# Patient Record
Sex: Female | Born: 1995 | Race: White | Hispanic: No | Marital: Single | Smoking: Current every day smoker
Health system: Southern US, Community
[De-identification: ages and names within clinical notes are randomized; demographics above are authoritative.]

## PROBLEM LIST (undated history)

## (undated) ENCOUNTER — Emergency Department (HOSPITAL_COMMUNITY): Admission: EM | Payer: No Typology Code available for payment source | Source: Home / Self Care

## (undated) DIAGNOSIS — O24419 Gestational diabetes mellitus in pregnancy, unspecified control: Secondary | ICD-10-CM

## (undated) DIAGNOSIS — Z98891 History of uterine scar from previous surgery: Secondary | ICD-10-CM

## (undated) DIAGNOSIS — R51 Headache: Secondary | ICD-10-CM

## (undated) HISTORY — DX: Gestational diabetes mellitus in pregnancy, unspecified control: O24.419

## (undated) HISTORY — DX: Headache: R51

---
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
## 2017-01-01 ENCOUNTER — Inpatient Hospital Stay
Admission: RE | Admit: 2017-01-01 | Discharge: 2017-01-01 | Disposition: A | Discharge status: Home / Self Care | Payer: Medicaid Other | Attending: Obstetrics and Gynecology | Admitting: Obstetrics and Gynecology

## 2017-01-01 DIAGNOSIS — IMO0001 Reserved for inherently not codable concepts without codable children: Secondary | ICD-10-CM

## 2017-01-01 DIAGNOSIS — O24414 Gestational diabetes mellitus in pregnancy, insulin controlled: Secondary | ICD-10-CM | POA: Insufficient documentation

## 2017-01-01 DIAGNOSIS — Z794 Long term (current) use of insulin: Secondary | ICD-10-CM | POA: Insufficient documentation

## 2017-01-01 DIAGNOSIS — Z369 Encounter for antenatal screening, unspecified: Secondary | ICD-10-CM

## 2017-01-01 DIAGNOSIS — Z3A35 35 weeks gestation of pregnancy: Secondary | ICD-10-CM | POA: Diagnosis not present

## 2017-01-01 DIAGNOSIS — O24419 Gestational diabetes mellitus in pregnancy, unspecified control: Secondary | ICD-10-CM

## 2017-01-01 MED ORDER — INSULIN NPH ISOPHANE & REGULAR (70-30) 100 UNIT/ML ~~LOC~~ SUSP: 26.0000 [IU] | Freq: Every day | SUBCUTANEOUS | 11 refills | Status: DC

## 2017-01-01 MED ORDER — INSULIN NPH (HUMAN) (ISOPHANE) 100 UNIT/ML ~~LOC~~ SUSP: 46.0000 [IU] | Freq: Every day | SUBCUTANEOUS | 11 refills | Status: DC

## 2017-01-01 MED ORDER — INSULIN NPH (HUMAN) (ISOPHANE) 100 UNIT/ML ~~LOC~~ SUSP: 26.0000 [IU] | Freq: Every day | SUBCUTANEOUS | 11 refills | Status: DC

## 2017-01-01 MED ORDER — INSULIN NPH ISOPHANE & REGULAR (70-30) 100 UNIT/ML ~~LOC~~ SUSP: 16.0000 [IU] | Freq: Every day | SUBCUTANEOUS | 11 refills | Status: DC

## 2017-01-01 NOTE — Discharge Instructions (Signed)
Take meds as prescribed. Monitor FKC's daily.

## 2017-01-01 NOTE — Discharge Summary (Signed)
Obstetric Discharge Summary Reason for Admission:Insulin Dep diabetic on pump in pregnancy Prenatal Procedures: NST Intrapartum Procedures: None yet Postpartum Procedures: none Complications-Operative and Postpartum: none Hemoglobin  Date Value Ref Range Status  12/07/2016 12.1 12.0 - 16.0 g/dL Final   HCT  Date Value Ref Range Status  12/07/2016 34.4 (L) 35.0 - 47.0 % Final    Physical Exam:  General: alert and cooperative UC: no UC noted on monitor strip FHT: reactive NST with multiple accels noted 15 x 15 BPM, no decels, Cat 1  Discharge Diagnoses: Insulin Dep Diabetic with reactive NST  Discharge Information: FU Monday at [facility] Date: 01/01/2017 Activity: Up ad lib Diet: Normal  Medications: see MMR Condition: Stable  Instructions:Monitor FKC's daily NPH Humulin 40 u before breakfast, 16 units NPH before bedtime, 70/30 20 units in am  Newborn Data: This patient has no babies on file.  Catheryn Bacon 01/01/2017, 2:58 PM

## 2017-01-01 NOTE — OB Triage Note (Signed)
Pt arrived to OBS 1 for her biweekly NST. Pt placed on monitor.

## 2017-01-04 ENCOUNTER — Encounter: Payer: Self-pay | Admitting: *Deleted

## 2017-01-04 ENCOUNTER — Inpatient Hospital Stay
Admission: RE | Admit: 2017-01-04 | Discharge: 2017-01-04 | Disposition: A | Discharge status: Home / Self Care | Payer: Medicaid Other | Attending: Obstetrics and Gynecology | Admitting: Obstetrics and Gynecology

## 2017-01-04 DIAGNOSIS — O1213 Gestational proteinuria, third trimester: Secondary | ICD-10-CM | POA: Diagnosis not present

## 2017-01-04 DIAGNOSIS — Z3A35 35 weeks gestation of pregnancy: Secondary | ICD-10-CM | POA: Insufficient documentation

## 2017-01-04 DIAGNOSIS — O24414 Gestational diabetes mellitus in pregnancy, insulin controlled: Secondary | ICD-10-CM | POA: Diagnosis not present

## 2017-01-04 DIAGNOSIS — Z369 Encounter for antenatal screening, unspecified: Secondary | ICD-10-CM

## 2017-01-04 LAB — CBC WITH DIFFERENTIAL/PLATELET
BASOS ABS: 0 10*3/uL (ref 0–0.1)
BASOS PCT: 0 %
Eosinophils Absolute: 0.1 10*3/uL (ref 0–0.7)
Eosinophils Relative: 1 %
HEMATOCRIT: 35.9 % (ref 35.0–47.0)
HEMOGLOBIN: 12.6 g/dL (ref 12.0–16.0)
Lymphocytes Relative: 24 %
Lymphs Abs: 2.2 10*3/uL (ref 1.0–3.6)
MCH: 30.3 pg (ref 26.0–34.0)
MCHC: 34.9 g/dL (ref 32.0–36.0)
MCV: 86.8 fL (ref 80.0–100.0)
Monocytes Absolute: 0.5 10*3/uL (ref 0.2–0.9)
Monocytes Relative: 6 %
NEUTROS ABS: 6.3 10*3/uL (ref 1.4–6.5)
NEUTROS PCT: 69 %
Platelets: 322 10*3/uL (ref 150–440)
RBC: 4.14 MIL/uL (ref 3.80–5.20)
RDW: 13.1 % (ref 11.5–14.5)
WBC: 9.1 10*3/uL (ref 3.6–11.0)

## 2017-01-04 LAB — COMPREHENSIVE METABOLIC PANEL
ALBUMIN: 3 g/dL — AB (ref 3.5–5.0)
ALK PHOS: 105 U/L (ref 38–126)
ALT: 20 U/L (ref 14–54)
AST: 33 U/L (ref 15–41)
Anion gap: 7 (ref 5–15)
BILIRUBIN TOTAL: 0.3 mg/dL (ref 0.3–1.2)
BUN: 10 mg/dL (ref 6–20)
CALCIUM: 9.3 mg/dL (ref 8.9–10.3)
CO2: 23 mmol/L (ref 22–32)
Chloride: 105 mmol/L (ref 101–111)
Creatinine, Ser: 0.48 mg/dL (ref 0.44–1.00)
GFR calc Af Amer: >60 mL/min (ref >60)
GFR calc non Af Amer: >60 mL/min (ref >60)
GLUCOSE: 79 mg/dL (ref 65–99)
Potassium: 3.7 mmol/L (ref 3.5–5.1)
SODIUM: 135 mmol/L (ref 135–145)
TOTAL PROTEIN: 6.8 g/dL (ref 6.5–8.1)

## 2017-01-04 LAB — URIC ACID: Uric Acid, Serum: 4.3 mg/dL (ref 2.3–6.6)

## 2017-01-04 LAB — PROTEIN / CREATININE RATIO, URINE
Creatinine, Urine: 165 mg/dL
PROTEIN CREATININE RATIO: 0.29 mg/mg{creat} — AB (ref 0.00–0.15)
Total Protein, Urine: 48 mg/dL

## 2017-01-04 NOTE — OB Triage Note (Signed)
G1P0 presents at [redacted]w[redacted]d for NST for gestational diabetes and preeclampsia workup. Was at Routine prenatal visit and had an elevated blood pressure.

## 2017-01-04 NOTE — Discharge Summary (Signed)
Pt d/c'd to home. Given container and instructions for 24 hour urine collection. Verbalized understanding. Given preeclampsia and preterm labor precautions.

## 2017-01-04 NOTE — Progress Notes (Signed)
Triage visit for NST   Julie [name] is a 21 y.o. G1P0. She is at [redacted]w[redacted]d gestation. She presents for a scheduled NST for insulin dependent gestational diabetes.  She had an elevated BP in the office today 137/91.  She denies HA, visual disturbances, and epigastric pain.  She endorses good fetal movement.  She denies ctxs, VB.  She is currently on Continue NPH 44 units in am & 22 units of reg in am and NPH 16 units and 14 units in the pm.  She did not bring her blood sugars into the office today, but she is planning to text them to Milon Score, CNM    O: Past Medical History:  Diagnosis Date  . Gestational diabetes   . ZOXWRUEA(540.9)    Past Surgical History:  Procedure Laterality Date  . TONSILLECTOMY     Social History   Social History  . Marital status: Single    Spouse name: N/A  . Number of children: N/A  . Years of education: N/A   Occupational History  . Not on file.   Social History Main Topics  . Smoking status: Never Smoker  . Smokeless tobacco: Never Used  . Alcohol use No  . Drug use:     Frequency: 2.0 times per week    Types: Marijuana     Comment: last time used before pregnancy  . Sexual activity: Yes   Other Topics Concern  . Not on file   Social History Narrative  . No narrative on file    BP 130/74   Pulse 89   LMP 05/04/2016 (Approximate)  Results for orders placed or performed during the hospital encounter of 01/04/17 (from the past 48 hour(s))  Protein / creatinine ratio, urine   Collection Time: 01/04/17 11:40 AM  Result Value Ref Range   Creatinine, Urine 165 mg/dL   Total Protein, Urine 48 mg/dL   Protein Creatinine Ratio 0.29 (H) 0.00 - 0.15 mg/mg[Cre]  CBC with Differential/Platelet   Collection Time: 01/04/17 12:20 PM  Result Value Ref Range   WBC 9.1 3.6 - 11.0 K/uL   RBC 4.14 3.80 - 5.20 MIL/uL   Hemoglobin 12.6 12.0 - 16.0 g/dL   HCT 81.1 91.4 - 78.2 %   MCV 86.8 80.0 - 100.0 fL   MCH 30.3 26.0 - 34.0 pg   MCHC 34.9 32.0  - 36.0 g/dL   RDW 95.6 21.3 - 08.6 %   Platelets 322 150 - 440 K/uL   Neutrophils Relative % 69 %   Neutro Abs 6.3 1.4 - 6.5 K/uL   Lymphocytes Relative 24 %   Lymphs Abs 2.2 1.0 - 3.6 K/uL   Monocytes Relative 6 %   Monocytes Absolute 0.5 0.2 - 0.9 K/uL   Eosinophils Relative 1 %   Eosinophils Absolute 0.1 0 - 0.7 K/uL   Basophils Relative 0 %   Basophils Absolute 0.0 0 - 0.1 K/uL  Comprehensive metabolic panel   Collection Time: 01/04/17 12:20 PM  Result Value Ref Range   Sodium 135 135 - 145 mmol/L   Potassium 3.7 3.5 - 5.1 mmol/L   Chloride 105 101 - 111 mmol/L   CO2 23 22 - 32 mmol/L   Glucose, Bld 79 65 - 99 mg/dL   BUN 10 6 - 20 mg/dL   Creatinine, Ser 5.78 0.44 - 1.00 mg/dL   Calcium 9.3 8.9 - 46.9 mg/dL   Total Protein 6.8 6.5 - 8.1 g/dL   Albumin 3.0 (L) 3.5 -  5.0 g/dL   AST 33 15 - 41 U/L   ALT 20 14 - 54 U/L   Alkaline Phosphatase 105 38 - 126 U/L   Total Bilirubin 0.3 0.3 - 1.2 mg/dL   GFR calc non Af Amer >60 >60 mL/min   GFR calc Af Amer >60 >60 mL/min   Anion gap 7 5 - 15  Uric acid   Collection Time: 01/04/17 12:20 PM  Result Value Ref Range   Uric Acid, Serum 4.3 2.3 - 6.6 mg/dL     Gen: NAD, AAOx3      Abd: FNTTP      Ext: Non-tender, Nonedmeatous    FHT: Baseline: 135 bpm/ moderate variability/ + 15x15 accels x 2/ no decels TOCO: quiet SVE:  deferred    A/P:  21 y.o. G1P0 [redacted]w[redacted]d with insulin dependent gestational diabetes and isolated elevated BP in office.     Reactive NST, with moderate variability and accelerations, no decels  In reviewing blood pressures, all BPs were normal, except for one that reveal 127/101.  Upon assessment, the patient did not have the cuff on properly and she states she rolled over onto the cuff. I believe this BP is inaccurate.    Proteinuria of 290 noted, we will send her home with a 24 hour urine and instructions reviewed   Growth US WNL today at office   Fetal Wellbeing: Reassuring Category 1 Fetal  tracing  D/c home stable, precautions reviewed, follow-up as scheduled.  Strict Fetal kick counts reviewed   Preterm labor and warning s/s reviewed   Next appt on 01/07/17 with Dr. Velta Addison, CNM

## 2017-01-06 ENCOUNTER — Other Ambulatory Visit
Admission: RE | Admit: 2017-01-06 | Discharge: 2017-01-06 | Disposition: A | Discharge status: Home / Self Care | Payer: Medicaid Other | Source: Ambulatory Visit | Attending: Obstetrics and Gynecology | Admitting: Obstetrics and Gynecology

## 2017-01-06 DIAGNOSIS — Z3A35 35 weeks gestation of pregnancy: Secondary | ICD-10-CM | POA: Insufficient documentation

## 2017-01-06 DIAGNOSIS — R030 Elevated blood-pressure reading, without diagnosis of hypertension: Secondary | ICD-10-CM | POA: Insufficient documentation

## 2017-01-06 DIAGNOSIS — O26893 Other specified pregnancy related conditions, third trimester: Secondary | ICD-10-CM | POA: Insufficient documentation

## 2017-01-06 DIAGNOSIS — O24414 Gestational diabetes mellitus in pregnancy, insulin controlled: Secondary | ICD-10-CM | POA: Diagnosis present

## 2017-01-06 LAB — PROTEIN, URINE, 24 HOUR
COLLECTION INTERVAL-UPROT: 24 h
PROTEIN, 24H URINE: 156 mg/d — AB (ref 50–100)
PROTEIN, URINE: 24 mg/dL
Urine Total Volume-UPROT: 650 mL

## 2017-01-07 ENCOUNTER — Inpatient Hospital Stay
Admission: RE | Admit: 2017-01-07 | Discharge: 2017-01-07 | Disposition: A | Discharge status: Home / Self Care | Payer: Medicaid Other | Attending: Obstetrics and Gynecology | Admitting: Obstetrics and Gynecology

## 2017-01-07 DIAGNOSIS — O24414 Gestational diabetes mellitus in pregnancy, insulin controlled: Secondary | ICD-10-CM | POA: Diagnosis present

## 2017-01-07 DIAGNOSIS — Z3A35 35 weeks gestation of pregnancy: Secondary | ICD-10-CM | POA: Insufficient documentation

## 2017-01-07 DIAGNOSIS — Z369 Encounter for antenatal screening, unspecified: Secondary | ICD-10-CM

## 2017-01-07 NOTE — Progress Notes (Signed)
Patient ID: Julie [name], female   DOB: [birth year], 21 y.o.   MRN: [id number] Julie [name] 19-Dec-1996 G1 P0 [redacted]w[redacted]d presents for NSt for HTN and A2GDM  noLOF , no vaginal bleeding , O;BP 138/81   Pulse 85   Temp 98.1 F (36.7 C) (Oral)   Resp 16   Ht 5\' 4"  (1.626 m)   Wt 264 lb (119.7 kg)   LMP 05/04/2016 (Approximate)   BMI 45.32 kg/m  ABDsoft NT  CX not checked  NSTreactive NSt  Labs: none  A: reactive NST A2 GDM  Insulin  P:cont 2x weekly NST testing

## 2017-01-07 NOTE — Discharge Summary (Signed)
  Suzy Bouchard, MD  Obstetrics    [] Hide copied text [] Hover for attribution information Patient ID: TRANY RADDEN, female   DOB: 02-27-1996, 21 y.o.   MRN: [id number] Zharia S Goldsmith Jan 02, 1996 G1 P0 [redacted]w[redacted]d presents for NSt for HTN and A2GDM  noLOF , no vaginal bleeding , O;BP 138/81   Pulse 85   Temp 98.1 F (36.7 C) (Oral)   Resp 16   Ht 5\' 4"  (1.626 m)   Wt 264 lb (119.7 kg)   LMP 05/04/2016 (Approximate)   BMI 45.32 kg/m  ABDsoft NT  CX not checked  NSTreactive NSt  Labs: none  A: reactive NST A2 GDM  Insulin  P:cont 2x weekly NST testing     Electronically signed by Suzy Bouchard, MD at 01/07/2017 11:11 AM      Admission (Current) on 1/11

## 2017-01-07 NOTE — Progress Notes (Signed)
Pt arrived with mother for her scheduled NST. Hx of Adolescense, Gestational Diabetes ( taking Insulin,). And query Gestational Hypertension., Pt of Dr schermerhorn

## 2017-01-07 NOTE — Discharge Instructions (Signed)
Pt and Mother on their way to regularly scheduled appt at 1100 with Dr Feliberto Gottron

## 2017-01-07 NOTE — Progress Notes (Signed)
Pt d/c'ed with Mother walking and belongings, to her 1100 appt with Dr Feliberto Gottron. Pt states she has already received information on Gestational Diabetes.

## 2017-01-11 ENCOUNTER — Encounter: Payer: Self-pay | Admitting: *Deleted

## 2017-01-11 ENCOUNTER — Inpatient Hospital Stay
Admission: RE | Admit: 2017-01-11 | Discharge: 2017-01-11 | Disposition: A | Discharge status: Home / Self Care | Payer: Medicaid Other | Attending: Obstetrics & Gynecology | Admitting: Obstetrics & Gynecology

## 2017-01-11 DIAGNOSIS — Z369 Encounter for antenatal screening, unspecified: Secondary | ICD-10-CM

## 2017-01-11 DIAGNOSIS — O0993 Supervision of high risk pregnancy, unspecified, third trimester: Secondary | ICD-10-CM | POA: Insufficient documentation

## 2017-01-11 DIAGNOSIS — Z3A36 36 weeks gestation of pregnancy: Secondary | ICD-10-CM | POA: Insufficient documentation

## 2017-01-11 LAB — UIFE/LIGHT CHAINS/TP QN, 24-HR UR
% BETA, Urine: 30.5 %
ALPHA 1 URINE: 5.2 %
Albumin, U: 15.2 %
Alpha 2, Urine: 25.7 %
Free Kappa/Lambda Ratio: 16 — ABNORMAL HIGH (ref 2.04–10.37)
Free Lambda Lt Chains,Ur: 10.5 mg/L — ABNORMAL HIGH (ref 0.24–6.66)
Free Lt Chn Excr Rate: 168 mg/L — ABNORMAL HIGH (ref 1.35–24.19)
GAMMA GLOBULIN URINE: 23.3 %
PDF: 0
Total Protein, Urine-Ur/day: 353 mg/24 hr — ABNORMAL HIGH (ref 30–150)
Total Protein, Urine: 12.4 mg/dL

## 2017-01-11 NOTE — OB Triage Note (Addendum)
Pt. Here for scheduled NST, also concerns about swelling in hands, feet, legs and vagina. Clean catch urine collected. Last sexual encounter was a "couple of weeks ago". Denies sudden gush of fluid or vaginal bleeding, small amount of white color discharge. Pain 0/10. Per patient -  blood sugar checked this morning and it was 86 mg/dl before breakfast. Pt. States, she did not check blood sugar after breakfast. See records for insulin requirements.  Positive for fetal movement.

## 2017-01-11 NOTE — Discharge Instructions (Signed)
Discharge instructions reviewed with patient, patient verbalized understanding. Patient encouraged to keep scheduled OB appointments.  NST reactive, patient discharged to home. Labor precautions reviewed with patient.

## 2017-01-13 NOTE — Discharge Summary (Signed)
Julie [name] is a 21 y.o. female. She is at [redacted]w[redacted]d gestation. Patient's last menstrual period was 05/04/2016 (approximate). Estimated Date of Delivery: 02/07/17    S: Resting comfortably. no CTX, no VB.no LOF,  Active fetal movement.      Maternal Medical History:   Past Medical History:  Diagnosis Date  . Gestational diabetes   . WVTVNRWC(136.4)     Past Surgical History:  Procedure Laterality Date  . TONSILLECTOMY      No Known Allergies  Prior to Admission medications   Medication Sig Start Date End Date Taking? Authorizing Provider  insulin NPH Human (HUMULIN N,NOVOLIN N) 100 UNIT/ML injection Inject 0.46 mLs (46 Units total) into the skin daily before breakfast. 01/01/17  Yes Sharee Pimple, CNM  insulin NPH Human (HUMULIN N,NOVOLIN N) 100 UNIT/ML injection Inject 0.26 mLs (26 Units total) into the skin at bedtime. Patient taking differently: Inject 24 Units into the skin at bedtime.  01/01/17  Yes Sharee Pimple, CNM  insulin NPH-regular Human (NOVOLIN 70/30) (70-30) 100 UNIT/ML injection Inject 26 Units into the skin daily with breakfast. 01/01/17  Yes Sharee Pimple, CNM  insulin NPH-regular Human (NOVOLIN 70/30) (70-30) 100 UNIT/ML injection Inject 16 Units into the skin daily with supper. 01/01/17  Yes Sharee Pimple, CNM  Prenatal Vit-Fe Fumarate-FA (MULTIVITAMIN-PRENATAL) 27-0.8 MG TABS tablet Take 1 tablet by mouth daily at 12 noon.   Yes Historical Provider, MD  amoxicillin (AMOXIL) 875 MG tablet Take 1 tablet (875 mg total) by mouth 2 (two) times daily. 12/07/16   Sharman Cheek, MD  IRON PO Take 1 tablet by mouth daily.    Historical Provider, MD     Prenatal care site:  [facility]   Social History: She  reports that she has quit smoking. Her smoking use included Cigarettes. She has never used smokeless tobacco. She reports that she uses drugs, including Marijuana, about 2 times per week. She reports that she does not drink alcohol.  Family History:  family history includes Anxiety disorder in her maternal grandmother and mother; Depression in her maternal grandmother and mother; Diabetes in her mother; Migraines in her maternal aunt, maternal grandmother, and maternal uncle.   Review of Systems: A full review of systems was performed and negative except as noted in the HPI.     O:  BP 136/83   Pulse 90   Temp 98.3 F (36.8 C) (Oral)   Resp 20   LMP 05/04/2016 (Approximate)   Constitutional: NAD, AAOx3  HE/ENT: extraocular movements grossly intact, moist mucous membranes CV: RRR PULM: nl respiratory effort, CTABL     Abd: gravid, non-tender, non-distended, soft      Ext: Non-tender, Nonedmeatous   Psych: mood appropriate, speech normal Pelvic:  NST performed TOCO: quiet    A/P:  20yo G1P0 @ 36.2 here for NST for high risk pregnancy, insulin-dependent diabetes  Labor: not present.   Fetal Wellbeing: Reassuring Cat 1 tracing.  Reactive NST  D/c home stable, precautions reviewed, follow-up as scheduled.   ----- Ranae Plumber, MD Attending Obstetrician and Gynecologist [facility], Department of OB/GYN [facility]

## 2017-01-13 NOTE — Procedures (Signed)
NST Patient's last menstrual period was 05/04/2016 (approximate). Estimated Date of Delivery: 02/07/17   Baseline: 150 Variability: moderate Accelerations present x >2 Decelerations absent Time  Interpretation: reactive NST, category 1 tracing  ----- Ranae Plumber, MD Attending Obstetrician and Gynecologist [facility], Department of OB/GYN [facility]

## 2017-01-15 ENCOUNTER — Inpatient Hospital Stay
Admission: RE | Admit: 2017-01-15 | Discharge: 2017-01-15 | Disposition: A | Discharge status: Home / Self Care | Payer: Medicaid Other | Attending: Obstetrics and Gynecology | Admitting: Obstetrics and Gynecology

## 2017-01-15 DIAGNOSIS — Z3A36 36 weeks gestation of pregnancy: Secondary | ICD-10-CM | POA: Diagnosis not present

## 2017-01-15 DIAGNOSIS — O24414 Gestational diabetes mellitus in pregnancy, insulin controlled: Secondary | ICD-10-CM | POA: Diagnosis not present

## 2017-01-15 DIAGNOSIS — O163 Unspecified maternal hypertension, third trimester: Secondary | ICD-10-CM | POA: Insufficient documentation

## 2017-01-15 NOTE — Progress Notes (Addendum)
   Triage visit for NST   Julie [name] is a 21 y.o. G1P0. She is at [redacted]w[redacted]d gestation. She presents for a scheduled NST.   Indication: Uncontrolled Insulin Dependent Gestational Diabetes and Gestational Hypertension   S: Resting comfortably. no CTX, no VB. Active fetal movement. No concerns   O:  BP (!) 142/82 (BP Location: Left Arm)   Pulse 92   Temp 98.7 F (37.1 C) (Oral)   Resp 20   LMP 05/04/2016 (Approximate)  No results found for this or any previous visit (from the past 48 hour(s)).   Repeat BP 135/74  Gen: NAD, AAOx3      Abd: FNTTP      Ext: Non-tender, Nonedmeatous    FHT: Baseline: 140 bpm  Moderate Variability/ 15 x 15 accelerations >2 / no decelerations TOCO: UI SVE:  deferred    A/P:  21 y.o. G1P0 [redacted]w[redacted]d with Uncontrolled Insulin Dependent Gestational Diabetes and Gestational Hypertension     Reactive NST, with moderate variability and accelerations, no decels  Category 1 Fetal tracing   Fetal Wellbeing: Reassuring  Strict Fetal Kick Counts  Preterm labor warning s/s reviewed   D/c home stable, precautions reviewed, follow-up as scheduled.   Missed appointment on 01/13/17 due to snow, scheduled f/u visit next week at [facility]  24 hour urine pending at [facility]   IOL scheduled for 01/25/17  Carlean Jews, CNM

## 2017-01-15 NOTE — Discharge Instructions (Signed)
Discharge instruction given and reviewed. Patient stated understanding. Please return to L&D Monday 1/22/2017at 1000am

## 2017-01-15 NOTE — Discharge Summary (Signed)
Pt d/c'd home after reactive NST. BP retaken upon d/c, 135/74. Follow up appointment made. No other concerns.

## 2017-01-18 ENCOUNTER — Observation Stay
Admission: AD | Admit: 2017-01-18 | Discharge: 2017-01-18 | Disposition: A | Discharge status: Home / Self Care | Payer: Medicaid Other | Source: Ambulatory Visit | Attending: Obstetrics and Gynecology | Admitting: Obstetrics and Gynecology

## 2017-01-18 DIAGNOSIS — Z3A37 37 weeks gestation of pregnancy: Secondary | ICD-10-CM | POA: Insufficient documentation

## 2017-01-18 DIAGNOSIS — O24419 Gestational diabetes mellitus in pregnancy, unspecified control: Secondary | ICD-10-CM | POA: Diagnosis present

## 2017-01-18 DIAGNOSIS — Z794 Long term (current) use of insulin: Secondary | ICD-10-CM | POA: Diagnosis not present

## 2017-01-18 DIAGNOSIS — O24414 Gestational diabetes mellitus in pregnancy, insulin controlled: Principal | ICD-10-CM | POA: Insufficient documentation

## 2017-01-18 LAB — OB RESULTS CONSOLE GBS: GBS: POSITIVE

## 2017-01-18 LAB — OB RESULTS CONSOLE GC/CHLAMYDIA
CHLAMYDIA, DNA PROBE: NEGATIVE
Gonorrhea: NEGATIVE

## 2017-01-18 NOTE — Plan of Care (Signed)
Reactive NST. Strip reviewed by dr Dalbert Garnet. Pt has appointment with dr schermerhorn at [facility] office today. Pt d/c home with d/c instructions

## 2017-01-18 NOTE — OB Triage Provider Note (Signed)
   Triage visit for NST   Julie [name] is a 21 y.o. G1P0. She is at [redacted]w[redacted]d gestation. She presents for a scheduled NST.  Indication: insulin dependent gestational diabetes  S: Resting comfortably. no CTX, no VB. Active fetal movement. O:  BP 134/72   Pulse 83   Temp 98.9 F (37.2 C)   LMP 05/04/2016 (Approximate)  No results found for this or any previous visit (from the past 48 hour(s)).   Gen: NAD, AAOx3      Abd: FNTTP      Ext: Non-tender, Nonedmeatous    NST  Baseline: 140 Variability: moderate Accelerations present x >2 Decelerations absent Time  Interpretation: reactive NST, category 1 tracing  A/P:  21 y.o. G1P0 [redacted]w[redacted]d with gDMA2.    Reactive NST, with moderate variability and accelerations, no decels  Fetal Wellbeing: Reassuring  D/c home stable, precautions reviewed, follow-up as scheduled.     ----- Christeen Douglas, MD MPH Attending Obstetrician and Gynecologist [facility], Department of OB/GYN [facility]

## 2017-01-18 NOTE — Plan of Care (Signed)
Pt here for NST 

## 2017-01-20 ENCOUNTER — Other Ambulatory Visit: Payer: Self-pay | Admitting: Obstetrics and Gynecology

## 2017-01-21 ENCOUNTER — Inpatient Hospital Stay
Admission: RE | Admit: 2017-01-21 | Discharge: 2017-01-21 | Disposition: A | Discharge status: Home / Self Care | Payer: Medicaid Other | Attending: Obstetrics & Gynecology | Admitting: Obstetrics & Gynecology

## 2017-01-21 DIAGNOSIS — Z3A37 37 weeks gestation of pregnancy: Secondary | ICD-10-CM | POA: Insufficient documentation

## 2017-01-21 DIAGNOSIS — O24414 Gestational diabetes mellitus in pregnancy, insulin controlled: Secondary | ICD-10-CM | POA: Insufficient documentation

## 2017-01-21 NOTE — Progress Notes (Signed)
Patient ID: [name], female   DOB: [birth year], 21 y.o.   MRN: [id number]                [] Hide copied text [] Hover for attribution information   Triage visit for NST   Julie [name] is a 21 y.o. G1P0. She is at 37w4/7 gestation. She presents for a scheduled NST.  Indication: insulin dependent gestational diabetes  S: Resting comfortably. no CTX, no VB. Active fetal movement. O:  BP 134/72   Pulse 83   Temp 98.9 F (37.2 C)   LMP 05/04/2016 (Approximate)  No results found for this or any previous visit (from the past 48 hour(s)).   Gen: NAD, AAOx3                                           Abd: Gravid                                                    Ext: Neg Homans                     NST Reactive with 2 accels 15 x 15 BPM  Baseline: 140 Variability: moderate Accelerations present x >2 Decelerations absent Time  Interpretation: reactive NST, category 1 tracing  A/P:  21 y.o. G1P0 [redacted]w[redacted]d with gDMA2.    Reactive NST, with moderate variability and accelerations, no decels  Fetal Wellbeing: Reassuring  D/c home stable, precautions reviewed, follow-up as scheduled.                       Pt to fu on Monday for IOL 01/25/17 as scheduled.   ----- Sharee Pimple, RN, MSN, CNM, FNP [facility]

## 2017-01-21 NOTE — OB Triage Note (Signed)
[name] here for scheduled NST, no complaints, states GDM well controlled, no issues with using insulin, AM fsbs 85

## 2017-01-25 ENCOUNTER — Inpatient Hospital Stay
Admission: RE | Admit: 2017-01-25 | Discharge: 2017-01-30 | DRG: 766 | Disposition: A | Discharge status: Home / Self Care | Payer: Medicaid Other | Attending: Obstetrics and Gynecology | Admitting: Obstetrics and Gynecology

## 2017-01-25 DIAGNOSIS — Z3A38 38 weeks gestation of pregnancy: Secondary | ICD-10-CM | POA: Diagnosis not present

## 2017-01-25 DIAGNOSIS — Z9889 Other specified postprocedural states: Secondary | ICD-10-CM

## 2017-01-25 DIAGNOSIS — O3483 Maternal care for other abnormalities of pelvic organs, third trimester: Secondary | ICD-10-CM | POA: Diagnosis present

## 2017-01-25 DIAGNOSIS — O24424 Gestational diabetes mellitus in childbirth, insulin controlled: Principal | ICD-10-CM | POA: Diagnosis present

## 2017-01-25 DIAGNOSIS — Z87891 Personal history of nicotine dependence: Secondary | ICD-10-CM | POA: Diagnosis not present

## 2017-01-25 DIAGNOSIS — O99214 Obesity complicating childbirth: Secondary | ICD-10-CM | POA: Diagnosis present

## 2017-01-25 DIAGNOSIS — O99824 Streptococcus B carrier state complicating childbirth: Secondary | ICD-10-CM | POA: Diagnosis present

## 2017-01-25 DIAGNOSIS — I501 Left ventricular failure: Secondary | ICD-10-CM | POA: Diagnosis not present

## 2017-01-25 DIAGNOSIS — J918 Pleural effusion in other conditions classified elsewhere: Secondary | ICD-10-CM | POA: Diagnosis not present

## 2017-01-25 DIAGNOSIS — O24419 Gestational diabetes mellitus in pregnancy, unspecified control: Secondary | ICD-10-CM | POA: Diagnosis present

## 2017-01-25 DIAGNOSIS — I429 Cardiomyopathy, unspecified: Secondary | ICD-10-CM | POA: Diagnosis not present

## 2017-01-25 DIAGNOSIS — N838 Other noninflammatory disorders of ovary, fallopian tube and broad ligament: Secondary | ICD-10-CM | POA: Diagnosis present

## 2017-01-25 DIAGNOSIS — I5031 Acute diastolic (congestive) heart failure: Secondary | ICD-10-CM | POA: Diagnosis not present

## 2017-01-25 LAB — GLUCOSE, CAPILLARY: GLUCOSE-CAPILLARY: 137 mg/dL — AB (ref 65–99)

## 2017-01-25 LAB — CBC
HCT: 33.6 % — ABNORMAL LOW (ref 35.0–47.0)
Hemoglobin: 11.9 g/dL — ABNORMAL LOW (ref 12.0–16.0)
MCH: 30.8 pg (ref 26.0–34.0)
MCHC: 35.6 g/dL (ref 32.0–36.0)
MCV: 86.5 fL (ref 80.0–100.0)
PLATELETS: 337 10*3/uL (ref 150–440)
RBC: 3.88 MIL/uL (ref 3.80–5.20)
RDW: 13.6 % (ref 11.5–14.5)
WBC: 9.6 10*3/uL (ref 3.6–11.0)

## 2017-01-25 LAB — TYPE AND SCREEN
ABO/RH(D): O POS
Antibody Screen: NEGATIVE

## 2017-01-25 MED ORDER — ONDANSETRON HCL 4 MG/2ML IJ SOLN: 4.0000 mg | Freq: Four times a day (QID) | INTRAMUSCULAR | Status: DC | PRN

## 2017-01-25 MED ORDER — LACTATED RINGERS IV SOLN
INTRAVENOUS | Status: DC
  Administered 2017-01-25 – 2017-01-27 (×6): via INTRAVENOUS

## 2017-01-25 MED ORDER — OXYCODONE-ACETAMINOPHEN 5-325 MG PO TABS: 2.0000 | ORAL_TABLET | ORAL | Status: DC | PRN

## 2017-01-25 MED ORDER — SOD CITRATE-CITRIC ACID 500-334 MG/5ML PO SOLN
30.0000 mL | ORAL | Status: DC | PRN
Start: 2017-01-25 — End: 2017-01-28
  Administered 2017-01-28: 30 mL via ORAL
  Filled 2017-01-25: qty 15

## 2017-01-25 MED ORDER — INSULIN DETEMIR 100 UNIT/ML ~~LOC~~ SOLN
24.0000 [IU] | Freq: Every day | SUBCUTANEOUS | Status: DC
  Administered 2017-01-25: 24 [IU] via SUBCUTANEOUS
  Filled 2017-01-25 (×2): qty 0.24

## 2017-01-25 MED ORDER — OXYTOCIN 40 UNITS IN LACTATED RINGERS INFUSION - SIMPLE MED: 2.5000 [IU]/h | INTRAVENOUS | Status: DC

## 2017-01-25 MED ORDER — LIDOCAINE HCL (PF) 1 % IJ SOLN
30.0000 mL | INTRAMUSCULAR | Status: DC | PRN
  Filled 2017-01-25: qty 30

## 2017-01-25 MED ORDER — DINOPROSTONE 10 MG VA INST
10.0000 mg | VAGINAL_INSERT | Freq: Once | VAGINAL | Status: AC
  Administered 2017-01-25: 10 mg via VAGINAL
  Filled 2017-01-25: qty 1

## 2017-01-25 MED ORDER — ZOLPIDEM TARTRATE 5 MG PO TABS
5.0000 mg | ORAL_TABLET | Freq: Every evening | ORAL | Status: DC | PRN
  Administered 2017-01-25 – 2017-01-26 (×2): 5 mg via ORAL
  Filled 2017-01-25 (×2): qty 1

## 2017-01-25 MED ORDER — OXYTOCIN BOLUS FROM INFUSION
500.0000 mL | Freq: Once | INTRAVENOUS | Status: DC
Start: 2017-01-25 — End: 2017-01-28

## 2017-01-25 MED ORDER — LACTATED RINGERS IV SOLN: 500.0000 mL | INTRAVENOUS | Status: DC | PRN

## 2017-01-25 MED ORDER — LIDOCAINE HCL (PF) 1 % IJ SOLN: 30.0000 mL | INTRAMUSCULAR | Status: DC | PRN

## 2017-01-25 MED ORDER — LACTATED RINGERS IV SOLN: INTRAVENOUS | Status: DC

## 2017-01-25 MED ORDER — BUTORPHANOL TARTRATE 1 MG/ML IJ SOLN
1.0000 mg | INTRAMUSCULAR | Status: DC | PRN
  Administered 2017-01-27: 1 mg via INTRAVENOUS
  Filled 2017-01-25: qty 1

## 2017-01-25 MED ORDER — LACTATED RINGERS IV SOLN
500.0000 mL | INTRAVENOUS | Status: DC | PRN
  Administered 2017-01-27 (×2): 500 mL via INTRAVENOUS
  Administered 2017-01-28: 1000 mL via INTRAVENOUS
  Administered 2017-01-28: 250 mL via INTRAVENOUS

## 2017-01-25 MED ORDER — ACETAMINOPHEN 325 MG PO TABS: 650.0000 mg | ORAL_TABLET | ORAL | Status: DC | PRN

## 2017-01-25 MED ORDER — INSULIN ASPART 100 UNIT/ML ~~LOC~~ SOLN
16.0000 [IU] | Freq: Once | SUBCUTANEOUS | Status: AC
  Administered 2017-01-25: 16 [IU] via SUBCUTANEOUS
  Filled 2017-01-25: qty 0.16

## 2017-01-25 MED ORDER — BUTORPHANOL TARTRATE 1 MG/ML IJ SOLN: 1.0000 mg | INTRAMUSCULAR | Status: DC | PRN

## 2017-01-25 MED ORDER — SODIUM CHLORIDE 0.9 % IV SOLN
2.0000 g | Freq: Once | INTRAVENOUS | Status: AC
  Administered 2017-01-27: 2 g via INTRAVENOUS
  Filled 2017-01-25: qty 2000

## 2017-01-25 MED ORDER — OXYTOCIN BOLUS FROM INFUSION: 500.0000 mL | Freq: Once | INTRAVENOUS | Status: DC

## 2017-01-25 MED ORDER — OXYCODONE-ACETAMINOPHEN 5-325 MG PO TABS: 1.0000 | ORAL_TABLET | ORAL | Status: DC | PRN

## 2017-01-25 MED ORDER — OXYTOCIN 40 UNITS IN LACTATED RINGERS INFUSION - SIMPLE MED
2.5000 [IU]/h | INTRAVENOUS | Status: DC
  Filled 2017-01-25 (×2): qty 1000

## 2017-01-25 NOTE — H&P (Signed)
Julie [name] is a 21 y.o. female  G1P0 EGA 38+1presenting for induction  Insulin dep GDM , suboptimally controlled through a large portion of pregnancy . Current insulin AM: 46 N / 26 R  Pm: 24N / 16 R  Pt ate tonight  Her regular meal and [facility] will administer her insulin as she normally would have. LAst u/s 01/04/17 = efw: 2743gm (56%).  OB History    Gravida Para Term Preterm AB Living   1 0           SAB TAB Ectopic Multiple Live Births                 Past Medical History:  Diagnosis Date  . Gestational diabetes   . RFFMBWGY(659.9)    Past Surgical History:  Procedure Laterality Date  . TONSILLECTOMY     Family History: family history includes Anxiety disorder in her maternal grandmother and mother; Depression in her maternal grandmother and mother; Diabetes in her mother; Migraines in her maternal aunt, maternal grandmother, and maternal uncle. Social History:  reports that she has quit smoking. Her smoking use included Cigarettes. She has never used smokeless tobacco. She reports that she uses drugs, including Marijuana, about 2 times per week. She reports that she does not drink alcohol.     Maternal Diabetes: Yes:  Diabetes Type:  Insulin/Medication controlled Genetic Screening: Normal Maternal Ultrasounds/Referrals: Normal Fetal Ultrasounds or other Referrals:  Other:  Maternal Substance Abuse:  No Significant Maternal Medications:  None Significant Maternal Lab Results:  + GBS  Other Comments:  None  ROS History   Last menstrual period 05/04/2016. Exam Physical Exam  Lungs CTA  CV RRR  adb : soft NT  NST reassuring , no decels  Prenatal labs: ABO, Rh:  o+ Antibody:  neg Rubella:  Non Imm, Varicella non imm RPR:   neg HBsAg:   neg HIV:   neg GBS:   Positive  Assessment/Plan: A2GDM , insulin  Administer normal dosing tonight , give PM snack Place cervidil tonight Reassess in am Insulin dosing based on if the pt will be allowed to eat regular diet   + GBS  , start Ampicillin once pt is in active labor or PROM   Julie [name] 01/25/2017, 8:02 PM

## 2017-01-26 LAB — GLUCOSE, CAPILLARY
GLUCOSE-CAPILLARY: 123 mg/dL — AB (ref 65–99)
GLUCOSE-CAPILLARY: 83 mg/dL (ref 65–99)
GLUCOSE-CAPILLARY: 75 mg/dL (ref 65–99)
Glucose-Capillary: 128 mg/dL — ABNORMAL HIGH (ref 65–99)
Glucose-Capillary: 152 mg/dL — ABNORMAL HIGH (ref 65–99)

## 2017-01-26 MED ORDER — INSULIN REGULAR HUMAN 100 UNIT/ML IJ SOLN: 26.0000 [IU] | Freq: Once | INTRAMUSCULAR | Status: DC

## 2017-01-26 MED ORDER — DINOPROSTONE 10 MG VA INST
10.0000 mg | VAGINAL_INSERT | Freq: Once | VAGINAL | Status: AC
  Administered 2017-01-26: 10 mg via VAGINAL
  Filled 2017-01-26: qty 1

## 2017-01-26 MED ORDER — INSULIN ASPART 100 UNIT/ML ~~LOC~~ SOLN
0.0000 [IU] | Freq: Three times a day (TID) | SUBCUTANEOUS | Status: DC
  Filled 2017-01-26 (×3): qty 0.15

## 2017-01-26 MED ORDER — INSULIN ASPART 100 UNIT/ML ~~LOC~~ SOLN
0.0000 [IU] | Freq: Three times a day (TID) | SUBCUTANEOUS | Status: DC
Start: 2017-01-26 — End: 2017-01-30
  Filled 2017-01-26 (×4): qty 0.15

## 2017-01-26 MED ORDER — INSULIN ASPART 100 UNIT/ML ~~LOC~~ SOLN
16.0000 [IU] | Freq: Three times a day (TID) | SUBCUTANEOUS | Status: DC
  Administered 2017-01-26 – 2017-01-27 (×2): 16 [IU] via SUBCUTANEOUS
  Filled 2017-01-26 (×3): qty 0.16
  Filled 2017-01-26: qty 16
  Filled 2017-01-26 (×3): qty 0.16

## 2017-01-26 MED ORDER — INSULIN ASPART 100 UNIT/ML ~~LOC~~ SOLN
26.0000 [IU] | Freq: Once | SUBCUTANEOUS | Status: AC
  Administered 2017-01-26: 26 [IU] via SUBCUTANEOUS
  Filled 2017-01-26: qty 26
  Filled 2017-01-26: qty 0.26

## 2017-01-26 MED ORDER — INSULIN ASPART 100 UNIT/ML ~~LOC~~ SOLN
16.0000 [IU] | Freq: Three times a day (TID) | SUBCUTANEOUS | Status: DC
  Filled 2017-01-26 (×3): qty 0.16

## 2017-01-26 MED ORDER — MISOPROSTOL 25 MCG QUARTER TABLET
25.0000 ug | ORAL_TABLET | ORAL | Status: DC | PRN
  Administered 2017-01-26 (×2): 25 ug via VAGINAL
  Filled 2017-01-26 (×2): qty 0.25

## 2017-01-26 NOTE — Progress Notes (Signed)
Reassessment of the cervix revealed a tight 1 cm opening. Cervical exams are very difficult for the patient and she is feeling overwhelmed by the discomfort of them. The cervix is very posterior.  We will start Cervidil overnight again, instead of starting Pitocin or placing a Foley bulb at this time. If his cervix becomes mid position or she becomes less difficult to check, mechanical dilation will be considered.

## 2017-01-26 NOTE — Progress Notes (Signed)
Diabetes Treatment Program Recommendations  ADA Standards of Care 2017 Diabetes in Pregnancy Target Glucose Ranges: Results for Julie, [name] (MRN [id number]) as of 01/26/2017 12:04  Ref. Range 01/25/2017 20:46 01/26/2017 08:09  Glucose-Capillary Latest Ref Range: 65 - 99 mg/dL 841 (H) 660 (H)   Fasting: 60 - 90 mg/dL Preprandial: 60 - 630 mg/dL 1 hr postprandial: Less than 140mg /dL (from first bite of meal) 2 hr postprandial: Less than 120 mg/dL (from first bit of meal)  Referral received.  Note history of gestational diabetes with insulin Home meds per Care Everywhere:   NPH 46 units AM and 26 units PM.  Regular insulin 20 units with breakfast and 12 units with supper.  Patient told RN that her regular insulin was recently increased to 26 unit with breakfast.  Per MD order, patient will not receive long acting insulin this morning however she is ordered to get meal coverage 26 units to cover her meal this AM.  Discussed with RN, she will obtain CBG prior to giving Novolog as patient just finished her morning meal.    Once in active labor, orders will change to hourly blood sugars and IV insulin per GS if blood sugars>120 mg/dL.  Reviewed use of Glucostabilizer with RN.  Will follow.  Thanks, Beryl Meager, RN, BC-ADM Inpatient Diabetes Coordinator Pager [phone] (8a-5p)

## 2017-01-26 NOTE — Plan of Care (Signed)
Problem: Education: Goal: Knowledge of Childbirth will improve Outcome: Progressing Pt admitted for IOL, gestational diabetes in pregnancy. On regular and long acting insulin.FHR tracing remains reactive. Cervix closed, Cervidil placed for induction. GBS pos, antibiotics to be started in active labor.

## 2017-01-26 NOTE — Plan of Care (Signed)
Problem: Education: Goal: Ability to describe self-care measures that may prevent or decrease complications (Diabetes Survival Skills Education) will improve Outcome: Progressing BS POCT q4h, BG level 137 overnight

## 2017-01-26 NOTE — Progress Notes (Addendum)
Patient ID: Julie [name], female   DOB: 02/19/1996, 21 y.o.   MRN: [id number]  Will start the glucosstabilizer when pt begins labor and if BG >120. Will check cervix and start cytotec if unchanged.  FHT: Reactive, baseline 140, mod var, no decels Toco: flat

## 2017-01-26 NOTE — Progress Notes (Signed)
Pt continues to ambulate in hallway, says she prefers to walk now until she has to be checked again.  EFM tracing maternal HR d/t movement, occasional FHR checked, 145 bpm and Cat 1 reactive tracing obtained prior to pt leaving room to ambulate.

## 2017-01-26 NOTE — Progress Notes (Signed)
Julie [name] is a 21 y.o. G1P0 at [redacted]w[redacted]d iol for poorly controlled insulin dependent gestational diabetes. Now s/p 12hrs cervadil overnight, 2 doses of cytotec during the day today - last one placed at 1740  Subjective: Not feeling much. S/p dinner  Objective: BP 134/78   Pulse 86   Temp 98.7 F (37.1 C) (Oral)   Resp 16   Ht 5\' 4"  (1.626 m)   Wt 268 lb (121.6 kg)   LMP 05/04/2016 (Approximate)   SpO2 92%   BMI 46.00 kg/m  I/O last 3 completed shifts: In: 3110.4 [I.V.:3110.4] Out: -  No intake/output data recorded.  FHT:  FHR: 145, mod var, +accels, no decels  UC:   regular, every 2-4 minutes, not feeling them SVE:   Dilation: Fingertip Effacement (%): Thick Station: -3 Exam by:: [employer]  Labs: Lab Results  Component Value Date   WBC 9.6 01/25/2017   HGB 11.9 (L) 01/25/2017   HCT 33.6 (L) 01/25/2017   MCV 86.5 01/25/2017   PLT 337 01/25/2017    Assessment / Plan: Induction of labor due to gestational diabetes,    Contracting with PGE. Plan for pitocin start vs cervadil based on contraction pattern for further ripening overnight. Will attempt Foley bulb overnight if able.  FHT- reassuring Not ruptured Sliding scale insulin while not in labor. When active, will start gluclose stabilizer per protocol if blood sugar >120. Home insulin regimen was 46N/22R in am and 24N/16R in pm. Will not given long acting but will give 16R with meals with SSI correction as needed.  Anticipate vaginal delivery  Christeen Douglas 01/26/2017, 7:20 PM

## 2017-01-26 NOTE — Progress Notes (Addendum)
Plan for shift reviewed with pt, including s/s to report if evident. Pt confirms active fetal mvmt, denies spotting, vaginal bleeding, leaking fluid or painful contractions. Pt states she would like to ambulate in hallway for a while before next exam. Will repeat exam in couple hours with possible foley bulb and/or Pitocin titration tonight. Patient request Ambien to help her rest overnight. Will continue BS monitoring per orders. Dr Dalbert Garnet in room to speak with pt and discuss options and review IOL plans. Pt and family present in room, understanding verbalized.

## 2017-01-27 ENCOUNTER — Encounter: Payer: Self-pay | Admitting: Anesthesiology

## 2017-01-27 ENCOUNTER — Inpatient Hospital Stay: Payer: Medicaid Other | Admitting: Anesthesiology

## 2017-01-27 LAB — GLUCOSE, CAPILLARY
GLUCOSE-CAPILLARY: 87 mg/dL (ref 65–99)
GLUCOSE-CAPILLARY: 66 mg/dL (ref 65–99)
GLUCOSE-CAPILLARY: 81 mg/dL (ref 65–99)
Glucose-Capillary: 89 mg/dL (ref 65–99)
Glucose-Capillary: 72 mg/dL (ref 65–99)
Glucose-Capillary: 67 mg/dL (ref 65–99)

## 2017-01-27 LAB — RPR: RPR: NONREACTIVE

## 2017-01-27 MED ORDER — SODIUM CHLORIDE FLUSH 0.9 % IV SOLN
INTRAVENOUS | Status: AC
  Administered 2017-01-27: 18:00:00
  Filled 2017-01-27: qty 10

## 2017-01-27 MED ORDER — SODIUM CHLORIDE FLUSH 0.9 % IV SOLN
INTRAVENOUS | Status: AC
  Administered 2017-01-27: 17:00:00
  Filled 2017-01-27: qty 10

## 2017-01-27 MED ORDER — DEXTROSE IN LACTATED RINGERS 5 % IV SOLN
INTRAVENOUS | Status: DC
  Administered 2017-01-27 – 2017-01-28 (×2): via INTRAVENOUS

## 2017-01-27 MED ORDER — SODIUM CHLORIDE 0.9 % IV SOLN
INTRAVENOUS | Status: AC
  Administered 2017-01-27: 1 g via INTRAVENOUS
  Filled 2017-01-27: qty 1000

## 2017-01-27 MED ORDER — BUPIVACAINE HCL (PF) 0.25 % IJ SOLN
INTRAMUSCULAR | Status: DC | PRN
  Administered 2017-01-27: 10 mL via EPIDURAL

## 2017-01-27 MED ORDER — SODIUM CHLORIDE 0.9 % IV SOLN
INTRAVENOUS | Status: DC
  Filled 2017-01-27: qty 2.5

## 2017-01-27 MED ORDER — LACTATED RINGERS IV SOLN: 500.0000 mL | Freq: Once | INTRAVENOUS | Status: AC

## 2017-01-27 MED ORDER — PHENYLEPHRINE 40 MCG/ML (10ML) SYRINGE FOR IV PUSH (FOR BLOOD PRESSURE SUPPORT): 80.0000 ug | PREFILLED_SYRINGE | INTRAVENOUS | Status: DC | PRN

## 2017-01-27 MED ORDER — EPHEDRINE 5 MG/ML INJ: 10.0000 mg | INTRAVENOUS | Status: DC | PRN

## 2017-01-27 MED ORDER — BUTORPHANOL TARTRATE 1 MG/ML IJ SOLN: 1.0000 mg | INTRAMUSCULAR | Status: DC | PRN

## 2017-01-27 MED ORDER — LIDOCAINE HCL (PF) 1 % IJ SOLN
INTRAMUSCULAR | Status: DC | PRN
  Administered 2017-01-27: 3 mL

## 2017-01-27 MED ORDER — FENTANYL 2.5 MCG/ML W/ROPIVACAINE 0.2% IN NS 100 ML EPIDURAL INFUSION (ARMC-ANES)
10.0000 mL/h | EPIDURAL | Status: DC
  Administered 2017-01-27: 10 mL/h via EPIDURAL

## 2017-01-27 MED ORDER — OXYTOCIN 40 UNITS IN LACTATED RINGERS INFUSION - SIMPLE MED
1.0000 m[IU]/min | INTRAVENOUS | Status: DC
  Administered 2017-01-27: 1 m[IU]/min via INTRAVENOUS

## 2017-01-27 MED ORDER — LIDOCAINE-EPINEPHRINE (PF) 1.5 %-1:200000 IJ SOLN
INTRAMUSCULAR | Status: DC | PRN
  Administered 2017-01-27: 3 mL via PERINEURAL

## 2017-01-27 MED ORDER — DIPHENHYDRAMINE HCL 50 MG/ML IJ SOLN: 12.5000 mg | INTRAMUSCULAR | Status: DC | PRN

## 2017-01-27 MED ORDER — LIDOCAINE HCL (PF) 2 % IJ SOLN
INTRAMUSCULAR | Status: DC | PRN
  Administered 2017-01-27: 5 mL via INTRADERMAL

## 2017-01-27 MED ORDER — TERBUTALINE SULFATE 1 MG/ML IJ SOLN: 0.2500 mg | Freq: Once | INTRAMUSCULAR | Status: DC | PRN

## 2017-01-27 MED ORDER — SODIUM CHLORIDE 0.9 % IV SOLN
1.0000 g | INTRAVENOUS | Status: DC
  Administered 2017-01-27 – 2017-01-28 (×5): 1 g via INTRAVENOUS
  Filled 2017-01-27 (×8): qty 1000

## 2017-01-27 NOTE — Anesthesia Preprocedure Evaluation (Addendum)
Anesthesia Evaluation  Patient identified by MRN, date of birth, ID band Patient awake    Reviewed: Allergy & Precautions, NPO status , Patient's Chart, lab work & pertinent test results  Airway Mallampati: II  TM Distance: >3 FB     Dental no notable dental hx. (+) Chipped   Pulmonary former smoker,    Pulmonary exam normal        Cardiovascular negative cardio ROS Normal cardiovascular exam     Neuro/Psych  Headaches, negative psych ROS   GI/Hepatic negative GI ROS, Neg liver ROS,   Endo/Other  diabetes, Well Controlled, Type 2, Insulin Dependent, Oral Hypoglycemic Agents  Renal/GU negative Renal ROS  negative genitourinary   Musculoskeletal negative musculoskeletal ROS (+)   Abdominal Normal abdominal exam  (+)   Peds negative pediatric ROS (+)  Hematology negative hematology ROS (+)   Anesthesia Other Findings   Reproductive/Obstetrics (+) Pregnancy                            Anesthesia Physical Anesthesia Plan  ASA: II  Anesthesia Plan: Epidural   Post-op Pain Management:    Induction: Intravenous  Airway Management Planned: Natural Airway  Additional Equipment:   Intra-op Plan:   Post-operative Plan:   Informed Consent: I have reviewed the patients History and Physical, chart, labs and discussed the procedure including the risks, benefits and alternatives for the proposed anesthesia with the patient or authorized representative who has indicated his/her understanding and acceptance.   Dental advisory given  Plan Discussed with: CRNA and Surgeon  Anesthesia Plan Comments:         Anesthesia Quick Evaluation

## 2017-01-27 NOTE — Progress Notes (Signed)
Julie [name] is a 21 y.o. G1P0 at [redacted]w[redacted]d by dating/US here for IOL for A2GDM on insulin and poorly controlled.   Subjective: I am hurting and want an epidural  Objective: BP 129/61   Pulse 88   Temp (!) 96.7 F (35.9 C) (Axillary)   Resp (!) 22   Ht 5\' 4"  (1.626 m)   Wt 121.6 kg (268 lb)   LMP 05/04/2016 (Approximate)   SpO2 92%   BMI 46.00 kg/m  I/O last 3 completed shifts: In: 5936.4 [I.V.:5936.4] Out: -  Total I/O In: 19.6 [I.V.:19.6] Out: -   FHT: 140  UC:   q 2-3 mins, adequate MVU's SVE:   Dilation: 3 Effacement (%): 90 Station: -3 Exam by:: C. Jones AROM with clear fluid and IUPC inserted with bloody return.  Labs: Lab Results  Component Value Date   WBC 9.6 01/25/2017   HGB 11.9 (L) 01/25/2017   HCT 33.6 (L) 01/25/2017   MCV 86.5 01/25/2017   PLT 337 01/25/2017  Glucose is running in the 60-80 range on the D5LR.   Assessment / Plan: A: IUP at term for iOL due to A2GDM 2. GBS Pos P: Continue Pitocin per protocol 2. Continue IUPC/ext fetal monitors. 3.Planning epidural soon.  Labor: Progressing normally Preeclampsia:  none Fetal Wellbeing:  Category I Pain Control:  IV pain meds I/D:  n/a Anticipated MOD:  NSVD  Sharee Pimple 01/27/2017, 3:24 PM

## 2017-01-27 NOTE — Progress Notes (Signed)
Artice S Tabor is a 21 y.o. G1P0 at [redacted]w[redacted]d by  admitted for IOl for poorly controlled diabetes on insulin.   Subjective: "I am not feeling any UC's"  Objective: BP 97/79 (BP Location: Left Arm)   Pulse 99   Temp 98.3 F (36.8 C) (Oral)   Resp 20   Ht 5\' 4"  (1.626 m)   Wt 121.6 kg (268 lb)   LMP 05/04/2016 (Approximate)   SpO2 92%   BMI 46.00 kg/m  I/O last 3 completed shifts: In: 5936.4 [I.V.:5936.4] Out: -  No intake/output data recorded.  FHT:  130, no decels, +accels, cat 1 UC:  occas SVE:   1-2/80%/vtx-3 Cervidil removed as pt had no UC's and did not feel any pain Speculum inserted and betadine to cx. Foley bulb placed with 30 ml's  Labs: Lab Results  Component Value Date   WBC 9.6 01/25/2017   HGB 11.9 (L) 01/25/2017   HCT 33.6 (L) 01/25/2017   MCV 86.5 01/25/2017   PLT 337 01/25/2017   Assessment / Plan: A: IOL for Insulin Dep Diabetes (poorly controlled)  2. GBS pos P: Foley bulb placed and taped tightly to Lt thigh 2. Start Pitocin per protocol 3. Antibotics for GBs + 4. Antic SVD.  Labor: IOL Preeclampsia: none Fetal Wellbeing: reassuring Pain Control:  None at present, plans epidural  I/D:  none Anticipated MOD: NSVD  Sharee Pimple 01/27/2017, 8:27 AM

## 2017-01-27 NOTE — Progress Notes (Signed)
Julie [name] is a 21 y.o. G1P0 at [redacted]w[redacted]d with iol for poorly controlled gDMA2, now on home short-acting insulin (16R) with meals, without NPH but with sliding scale if needed. Glucose-stabilizer is ordered and pended and will be activated if BG >120 in active labor. She is currently on q4hr checks. Plan for q2hr checks in active labor.  Subjective: Feeling contractions mildly q91min, received stadol 1hr ago. Blood sugar in 80s since yesterday lunchtime. This is her second night of induction. She received cervadil overnight on day 1, q4hr cytotec during the day yesterday, and cervadil #2 placed last night at 2230. She is 1cm, posterior and uncomfortable with checks.  Objective: BP 97/79 (BP Location: Left Arm)   Pulse 99   Temp 98.3 F (36.8 C) (Oral)   Resp 20   Ht 5\' 4"  (1.626 m)   Wt 268 lb (121.6 kg)   LMP 05/04/2016 (Approximate)   SpO2 92%   BMI 46.00 kg/m  I/O last 3 completed shifts: In: 5936.4 [I.V.:5936.4] Out: -  No intake/output data recorded.  FHT:  135, mod var, +accels, no decels UC:   irregular, every 5 minutes SVE:   Dilation: 1 Effacement (%): 30 Station: -3 Exam by:: Dr Dalbert Garnet  Labs: Lab Results  Component Value Date   WBC 9.6 01/25/2017   HGB 11.9 (L) 01/25/2017   HCT 33.6 (L) 01/25/2017   MCV 86.5 01/25/2017   PLT 337 01/25/2017    Assessment / Plan:  Continue induction of labor. She is not in labor yet. Mechanical cervical dilation is reasonable when cervidil removed or with pitocin. - Continue 16R insulin with meals and q4hrs BG checks, and checks with meals. Sliding scale if needed. Gluose stabilizer protocl in active labor if needed. - Fetal status: reassuring   [name] 01/27/2017, 7:04 AM

## 2017-01-27 NOTE — Progress Notes (Signed)
Julie [name] is a 21 y.o. G1P0 at [redacted]w[redacted]d admitted for IOL for poorly controlled diabetes on insulin  Subjective: Feeling some UC  Objective: BP (!) 141/74   Pulse 81   Temp (!) 96.7 F (35.9 C) (Axillary)   Resp (!) 22   Ht 5\' 4"  (1.626 m)   Wt 121.6 kg (268 lb)   LMP 05/04/2016 (Approximate)   SpO2 92%   BMI 46.00 kg/m  I/O last 3 completed shifts: In: 5936.4 [I.V.:5936.4] Out: -  No intake/output data recorded.  FHT:  140, +accels, no decels Cat 1 UC:   regular, every 3  minutes SVE:   Dilation: 2.5 Effacement (%): 50 Station: -3 Exam by:: J. Grindheim  Labs: Lab Results  Component Value Date   WBC 9.6 01/25/2017   HGB 11.9 (L) 01/25/2017   HCT 33.6 (L) 01/25/2017   MCV 86.5 01/25/2017   PLT 337 01/25/2017    Assessment / Plan: A: A2 GDM at 38 2/7 weeks on Insulin (Glucostabilizer) P: Observe VS and FHR closely. 2. Monitor glucose and administer glucose by the stabiliizer orders. Labor: IOL with foley bulb and Pitocin drip  Fetal Wellbeing:  reassuring Pain Control: will plan Stadol and Epidural  Anticipated MOD:  Aiming for NSVD  Sharee Pimple 01/27/2017, 12:35 PM

## 2017-01-27 NOTE — Progress Notes (Signed)
Julie [name] is a 21 y.o. G1P0 at [redacted]w[redacted]d IOl for term pregnancy with poorly controlled DM on insulin prior to labor.   Subjective: I am comfortable now Objective: BP 137/74   Pulse 89   Temp 98.1 F (36.7 C) (Oral)   Resp 16   Ht 5\' 4"  (1.626 m)   Wt 121.6 kg (268 lb)   LMP 05/04/2016 (Approximate)   SpO2 98%   BMI 46.00 kg/m  I/O last 3 completed shifts: In: 4447.9 [I.V.:4447.9] Out: -  Total I/O In: 1000 [I.V.:1000] Out: -   FHT:  145, Cat 1, no decels, +accels (after epidurall pt had decel to 80's x 2 with recovery and had a late decel and it was recovered and now improved after repositioning and fluid bolus.  UC:   q 3 mins, MVU"s: 170 after Pitocin turned down for the decel. Will retitrate back up. On 8 mun/min SVE:   Dilation: 4 Effacement (%): 90 Station: -3 Exam by:: C. Jones CNM  Labs: Lab Results  Component Value Date   WBC 9.6 01/25/2017   HGB 11.9 (L) 01/25/2017   HCT 33.6 (L) 01/25/2017   MCV 86.5 01/25/2017   PLT 337 01/25/2017    Assessment / Plan: A: IUP at term 2. A2GDM poorly controlled with insulin 3. Teen pregnancy P: 1. Glucose Stabilizer protocol Glucose 60-80 zone so no insulin needed 2. Continue to labor during the night. Disc poss of LTCS with family as pt has an EFW  #8#7oz baby and has not delivered vaginally in the past. Pt does not have a contracted pelvis by exam but, could have a contracted pelvis above the ischial spines.  3.Continue to monitor closely and Dr Feliberto Gottron was given report 1 hour ago and aware of pt status and agrees with plan of care.    Labor: pattern is now inadequate due to cutting Pitocin but, will retitrate up now that FHR is Cat 1 Preeclampsia: none Fetal Wellbeing:  Reassuring Pain Control: controlled with epidural Anticipated MOD:  NSVD  Sharee Pimple 01/27/2017, 10:14 PM

## 2017-01-27 NOTE — Anesthesia Procedure Notes (Signed)
Epidural Patient location during procedure: OB Start time: 01/27/2017 8:11 PM End time: 01/27/2017 8:22 PM  Staffing Anesthesiologist: Yves Dill Performed: anesthesiologist   Preanesthetic Checklist Completed: patient identified, site marked, surgical consent, pre-op evaluation, timeout performed, IV checked, risks and benefits discussed and monitors and equipment checked  Epidural Patient position: sitting Prep: Betadine and site prepped and draped Patient monitoring: heart rate, cardiac monitor, continuous pulse ox and blood pressure Approach: midline Location: L3-L4 Injection technique: LOR air  Needle:  Needle type: Tuohy  Needle gauge: 18 G Needle length: 9 cm Needle insertion depth: 7 cm Catheter size: 20 Guage Test dose: negative and 1.5% lidocaine with Epi 1:200 K  Additional Notes Time out called .Marland Kitchen Patient placed in sitting position.  Back prepped and draped in sterile fashion.  A skin wheal was made in the L3-L4 interspace with 1% Lidocaine plain.  An 18G Tuohy needle was guided into the epidural space by a loss of resistance technique.  No blood or paresthesias.  Negative Test dose.  The catheter was affixed to the back in sterile fashion.  Patient tolerated the procedure well.Reason for block:procedure for pain

## 2017-01-28 ENCOUNTER — Encounter
Admission: RE | Disposition: A | Discharge status: Home / Self Care | Payer: Self-pay | Source: Home / Self Care | Attending: Obstetrics and Gynecology

## 2017-01-28 DIAGNOSIS — Z98891 History of uterine scar from previous surgery: Secondary | ICD-10-CM

## 2017-01-28 DIAGNOSIS — Z9889 Other specified postprocedural states: Secondary | ICD-10-CM

## 2017-01-28 HISTORY — DX: History of uterine scar from previous surgery: Z98.891

## 2017-01-28 LAB — GLUCOSE, CAPILLARY
GLUCOSE-CAPILLARY: 84 mg/dL (ref 65–99)
Glucose-Capillary: 101 mg/dL — ABNORMAL HIGH (ref 65–99)
Glucose-Capillary: 103 mg/dL — ABNORMAL HIGH (ref 65–99)
Glucose-Capillary: 88 mg/dL (ref 65–99)

## 2017-01-28 SURGERY — Surgical Case
Anesthesia: Epidural | Site: Abdomen | Wound class: Clean Contaminated

## 2017-01-28 MED ORDER — LACTATED RINGERS IV SOLN
INTRAVENOUS | Status: DC
  Administered 2017-01-28 (×2): via INTRAVENOUS

## 2017-01-28 MED ORDER — SIMETHICONE 80 MG PO CHEW: 80.0000 mg | CHEWABLE_TABLET | ORAL | Status: DC | PRN

## 2017-01-28 MED ORDER — MENTHOL 3 MG MT LOZG
1.0000 | LOZENGE | OROMUCOSAL | Status: DC | PRN
  Filled 2017-01-28: qty 9

## 2017-01-28 MED ORDER — MORPHINE SULFATE (PF) 0.5 MG/ML IJ SOLN
INTRAMUSCULAR | Status: DC | PRN
  Administered 2017-01-28: 3 mg via EPIDURAL

## 2017-01-28 MED ORDER — ZOLPIDEM TARTRATE 5 MG PO TABS: 5.0000 mg | ORAL_TABLET | Freq: Every evening | ORAL | Status: DC | PRN

## 2017-01-28 MED ORDER — PRENATAL MULTIVITAMIN CH
1.0000 | ORAL_TABLET | Freq: Every day | ORAL | Status: DC
  Administered 2017-01-28 – 2017-01-29 (×2): 1 via ORAL
  Filled 2017-01-28 (×2): qty 1

## 2017-01-28 MED ORDER — SODIUM CHLORIDE 0.9% FLUSH: 3.0000 mL | INTRAVENOUS | Status: DC | PRN

## 2017-01-28 MED ORDER — DIBUCAINE 1 % RE OINT: 1.0000 "application " | TOPICAL_OINTMENT | RECTAL | Status: DC | PRN

## 2017-01-28 MED ORDER — BISACODYL 10 MG RE SUPP: 10.0000 mg | Freq: Every day | RECTAL | Status: DC | PRN

## 2017-01-28 MED ORDER — OXYCODONE-ACETAMINOPHEN 5-325 MG PO TABS: 2.0000 | ORAL_TABLET | ORAL | Status: DC | PRN

## 2017-01-28 MED ORDER — IBUPROFEN 600 MG PO TABS
600.0000 mg | ORAL_TABLET | Freq: Four times a day (QID) | ORAL | Status: DC | PRN
  Administered 2017-01-28: 600 mg via ORAL
  Filled 2017-01-28: qty 1

## 2017-01-28 MED ORDER — MORPHINE SULFATE (PF) 0.5 MG/ML IJ SOLN
INTRAMUSCULAR | Status: AC
  Filled 2017-01-28: qty 10

## 2017-01-28 MED ORDER — DIPHENHYDRAMINE HCL 25 MG PO CAPS
25.0000 mg | ORAL_CAPSULE | Freq: Four times a day (QID) | ORAL | Status: DC | PRN
Start: 2017-01-28 — End: 2017-01-28

## 2017-01-28 MED ORDER — CEFAZOLIN SODIUM-DEXTROSE 2-3 GM-% IV SOLR
2.0000 g | Freq: Once | INTRAVENOUS | Status: AC
  Administered 2017-01-28: 2 g via INTRAVENOUS
  Filled 2017-01-28: qty 50

## 2017-01-28 MED ORDER — DEXTROSE 5 % IV SOLN
500.0000 mg | INTRAVENOUS | Status: DC
  Administered 2017-01-28: 500 mg via INTRAVENOUS
  Filled 2017-01-28: qty 500

## 2017-01-28 MED ORDER — IBUPROFEN 600 MG PO TABS: 600.0000 mg | ORAL_TABLET | Freq: Four times a day (QID) | ORAL | Status: DC

## 2017-01-28 MED ORDER — PROPOFOL 10 MG/ML IV BOLUS
INTRAVENOUS | Status: AC
  Filled 2017-01-28: qty 20

## 2017-01-28 MED ORDER — WITCH HAZEL-GLYCERIN EX PADS: 1.0000 "application " | MEDICATED_PAD | CUTANEOUS | Status: DC | PRN

## 2017-01-28 MED ORDER — SENNOSIDES-DOCUSATE SODIUM 8.6-50 MG PO TABS
2.0000 | ORAL_TABLET | ORAL | Status: DC
  Administered 2017-01-28 – 2017-01-29 (×2): 2 via ORAL
  Filled 2017-01-28 (×2): qty 2

## 2017-01-28 MED ORDER — ONDANSETRON HCL 4 MG/2ML IJ SOLN: 4.0000 mg | Freq: Three times a day (TID) | INTRAMUSCULAR | Status: DC | PRN

## 2017-01-28 MED ORDER — TETANUS-DIPHTH-ACELL PERTUSSIS 5-2.5-18.5 LF-MCG/0.5 IM SUSP: 0.5000 mL | Freq: Once | INTRAMUSCULAR | Status: DC

## 2017-01-28 MED ORDER — SIMETHICONE 80 MG PO CHEW: 80.0000 mg | CHEWABLE_TABLET | Freq: Three times a day (TID) | ORAL | Status: DC

## 2017-01-28 MED ORDER — PRENATAL MULTIVITAMIN CH: 1.0000 | ORAL_TABLET | Freq: Every day | ORAL | Status: DC

## 2017-01-28 MED ORDER — INSULIN ASPART 100 UNIT/ML ~~LOC~~ SOLN
0.0000 [IU] | SUBCUTANEOUS | Status: DC
  Filled 2017-01-28 (×2): qty 0.15

## 2017-01-28 MED ORDER — NALOXONE HCL 2 MG/2ML IJ SOSY
1.0000 ug/kg/h | PREFILLED_SYRINGE | INTRAVENOUS | Status: DC | PRN
  Filled 2017-01-28: qty 2

## 2017-01-28 MED ORDER — CEFAZOLIN SODIUM-DEXTROSE 2-3 GM-% IV SOLR
INTRAVENOUS | Status: DC | PRN
  Administered 2017-01-28: 2 g via INTRAVENOUS

## 2017-01-28 MED ORDER — OXYCODONE-ACETAMINOPHEN 5-325 MG PO TABS
2.0000 | ORAL_TABLET | ORAL | Status: DC | PRN
  Administered 2017-01-28 – 2017-01-30 (×7): 2 via ORAL
  Filled 2017-01-28 (×7): qty 2

## 2017-01-28 MED ORDER — SENNOSIDES-DOCUSATE SODIUM 8.6-50 MG PO TABS: 2.0000 | ORAL_TABLET | ORAL | Status: DC

## 2017-01-28 MED ORDER — ONDANSETRON HCL 4 MG/2ML IJ SOLN: 4.0000 mg | Freq: Once | INTRAMUSCULAR | Status: DC | PRN

## 2017-01-28 MED ORDER — COCONUT OIL OIL: 1.0000 "application " | TOPICAL_OIL | Status: DC | PRN

## 2017-01-28 MED ORDER — SIMETHICONE 80 MG PO CHEW
80.0000 mg | CHEWABLE_TABLET | Freq: Three times a day (TID) | ORAL | Status: DC
  Administered 2017-01-28: 80 mg via ORAL
  Filled 2017-01-28 (×2): qty 1

## 2017-01-28 MED ORDER — NALBUPHINE HCL 10 MG/ML IJ SOLN: 5.0000 mg | INTRAMUSCULAR | Status: DC | PRN

## 2017-01-28 MED ORDER — LIDOCAINE 2% (20 MG/ML) 5 ML SYRINGE
INTRAMUSCULAR | Status: DC | PRN
  Administered 2017-01-28: 4 mg via INTRAVENOUS
  Administered 2017-01-28 (×4): 5 mg via INTRAVENOUS

## 2017-01-28 MED ORDER — OXYCODONE-ACETAMINOPHEN 5-325 MG PO TABS
1.0000 | ORAL_TABLET | ORAL | Status: DC | PRN
  Administered 2017-01-28: 1 via ORAL
  Filled 2017-01-28: qty 1

## 2017-01-28 MED ORDER — OXYTOCIN 40 UNITS IN LACTATED RINGERS INFUSION - SIMPLE MED
INTRAVENOUS | Status: DC | PRN
  Administered 2017-01-28: 1 mL via INTRAVENOUS
  Administered 2017-01-28: 399 mL via INTRAVENOUS

## 2017-01-28 MED ORDER — MORPHINE SULFATE (PF) 2 MG/ML IV SOLN: 1.0000 mg | INTRAVENOUS | Status: DC | PRN

## 2017-01-28 MED ORDER — SIMETHICONE 80 MG PO CHEW: 80.0000 mg | CHEWABLE_TABLET | ORAL | Status: DC

## 2017-01-28 MED ORDER — OXYTOCIN 40 UNITS IN LACTATED RINGERS INFUSION - SIMPLE MED: 2.5000 [IU]/h | INTRAVENOUS | Status: DC

## 2017-01-28 MED ORDER — MORPHINE SULFATE (PF) 0.5 MG/ML IJ SOLN: INTRAMUSCULAR | Status: DC | PRN

## 2017-01-28 MED ORDER — LACTATED RINGERS IV SOLN: INTRAVENOUS | Status: DC

## 2017-01-28 MED ORDER — DIPHENHYDRAMINE HCL 25 MG PO CAPS
25.0000 mg | ORAL_CAPSULE | ORAL | Status: DC | PRN
  Administered 2017-01-28: 25 mg via ORAL
  Filled 2017-01-28: qty 1

## 2017-01-28 MED ORDER — DIPHENHYDRAMINE HCL 50 MG/ML IJ SOLN: 12.5000 mg | INTRAMUSCULAR | Status: DC | PRN

## 2017-01-28 MED ORDER — IBUPROFEN 600 MG PO TABS
600.0000 mg | ORAL_TABLET | Freq: Four times a day (QID) | ORAL | Status: DC
  Administered 2017-01-28 – 2017-01-30 (×8): 600 mg via ORAL
  Filled 2017-01-28 (×8): qty 1

## 2017-01-28 MED ORDER — DIPHENHYDRAMINE HCL 25 MG PO CAPS: 25.0000 mg | ORAL_CAPSULE | Freq: Four times a day (QID) | ORAL | Status: DC | PRN

## 2017-01-28 MED ORDER — FLEET ENEMA 7-19 GM/118ML RE ENEM: 1.0000 | ENEMA | Freq: Every day | RECTAL | Status: DC | PRN

## 2017-01-28 MED ORDER — MEASLES, MUMPS & RUBELLA VAC ~~LOC~~ INJ
0.5000 mL | INJECTION | Freq: Once | SUBCUTANEOUS | Status: AC
  Administered 2017-01-30: 0.5 mL via SUBCUTANEOUS
  Filled 2017-01-28: qty 0.5

## 2017-01-28 MED ORDER — NALBUPHINE HCL 10 MG/ML IJ SOLN: 5.0000 mg | Freq: Once | INTRAMUSCULAR | Status: DC | PRN

## 2017-01-28 MED ORDER — CEFAZOLIN SODIUM-DEXTROSE 2-4 GM/100ML-% IV SOLN: 2.0000 g | Freq: Once | INTRAVENOUS | Status: DC

## 2017-01-28 MED ORDER — ACETAMINOPHEN 325 MG PO TABS: 650.0000 mg | ORAL_TABLET | ORAL | Status: DC | PRN

## 2017-01-28 MED ORDER — OXYTOCIN 40 UNITS IN LACTATED RINGERS INFUSION - SIMPLE MED
2.5000 [IU]/h | INTRAVENOUS | Status: DC
  Filled 2017-01-28: qty 1000

## 2017-01-28 MED ORDER — FENTANYL CITRATE (PF) 100 MCG/2ML IJ SOLN
25.0000 ug | INTRAMUSCULAR | Status: DC | PRN
  Administered 2017-01-28 (×4): 25 ug via INTRAVENOUS
  Filled 2017-01-28: qty 2

## 2017-01-28 MED ORDER — LACTATED RINGERS IV SOLN
INTRAVENOUS | Status: DC | PRN
  Administered 2017-01-28: 06:00:00 via INTRAVENOUS

## 2017-01-28 MED ORDER — NALOXONE HCL 0.4 MG/ML IJ SOLN: 0.4000 mg | INTRAMUSCULAR | Status: DC | PRN

## 2017-01-28 MED ORDER — SCOPOLAMINE 1 MG/3DAYS TD PT72
1.0000 | MEDICATED_PATCH | Freq: Once | TRANSDERMAL | Status: DC
  Administered 2017-01-28: 1.5 mg via TRANSDERMAL
  Filled 2017-01-28: qty 1

## 2017-01-28 MED ORDER — MEPERIDINE HCL 25 MG/ML IJ SOLN: 6.2500 mg | INTRAMUSCULAR | Status: DC | PRN

## 2017-01-28 SURGICAL SUPPLY — 26 items
BARRIER ADHS 3X4 INTERCEED (GAUZE/BANDAGES/DRESSINGS) ×2 IMPLANT
CANISTER SUCT 3000ML (MISCELLANEOUS) ×2 IMPLANT
CATH KIT ON-Q SILVERSOAK 5 (CATHETERS) IMPLANT
CATH KIT ON-Q SILVERSOAK 5IN (CATHETERS) IMPLANT
CHLORAPREP W/TINT 26ML (MISCELLANEOUS) ×2 IMPLANT
DRSG TELFA 3X8 NADH (GAUZE/BANDAGES/DRESSINGS) IMPLANT
ELECT CAUTERY BLADE 6.4 (BLADE) ×2 IMPLANT
ELECT REM PT RETURN 9FT ADLT (ELECTROSURGICAL) ×2
ELECTRODE REM PT RTRN 9FT ADLT (ELECTROSURGICAL) ×1 IMPLANT
GAUZE SPONGE 4X4 12PLY STRL (GAUZE/BANDAGES/DRESSINGS) IMPLANT
GLOVE BIO SURGEON STRL SZ8 (GLOVE) ×6 IMPLANT
GOWN STRL REUS W/ TWL LRG LVL3 (GOWN DISPOSABLE) ×2 IMPLANT
GOWN STRL REUS W/ TWL XL LVL3 (GOWN DISPOSABLE) ×1 IMPLANT
GOWN STRL REUS W/TWL LRG LVL3 (GOWN DISPOSABLE) ×2
GOWN STRL REUS W/TWL XL LVL3 (GOWN DISPOSABLE) ×1
NS IRRIG 1000ML POUR BTL (IV SOLUTION) ×2 IMPLANT
PACK C SECTION AR (MISCELLANEOUS) ×2 IMPLANT
PAD DRESSING TELFA 3X8 NADH (GAUZE/BANDAGES/DRESSINGS) IMPLANT
PAD OB MATERNITY 4.3X12.25 (PERSONAL CARE ITEMS) ×4 IMPLANT
PAD PREP 24X41 OB/GYN DISP (PERSONAL CARE ITEMS) ×2 IMPLANT
SPONGE LAP 18X18 5 PK (GAUZE/BANDAGES/DRESSINGS) ×2 IMPLANT
STAPLER INSORB 30 2030 C-SECTI (MISCELLANEOUS) ×2 IMPLANT
STRAP SAFETY BODY (MISCELLANEOUS) ×6 IMPLANT
SUT CHROMIC 1 CTX 36 (SUTURE) ×6 IMPLANT
SUT PLAIN GUT 0 (SUTURE) ×4 IMPLANT
SUT VIC AB 0 CT1 36 (SUTURE) ×4 IMPLANT

## 2017-01-28 NOTE — Progress Notes (Signed)
Lousie S Farquhar is a 21 y.o. G1P0 at [redacted]w[redacted]d here on 3 day IOL for poorly controlled A2GDM. Called to review strip by RN. Subjective: "Epidural still working"  Objective: BP 127/69   Pulse 92   Temp 98.4 F (36.9 C) (Oral)   Resp 18   Ht 5\' 4"  (1.626 m)   Wt 121.6 kg (268 lb)   LMP 05/04/2016 (Approximate)   SpO2 96%   BMI 46.00 kg/m  I/O last 3 completed shifts: In: 5145.8 [I.V.:5145.8] Out: -  Total I/O In: 2454.2 [I.V.:2304.2; IV Piggyback:150] Out: 150 [Urine:150]  FHT:  145, +accels, no decels now formerly minimal variability Cat 2 with rare late decel but, now Cat 1 on O2, after repositioning,  UC:  occas UC, Pitocin off due to late decels  Noted and now corrected.  SVE:   5/90/vtx-2/-1 +molding noted.   Labs: Lab Results  Component Value Date   WBC 9.6 01/25/2017   HGB 11.9 (L) 01/25/2017   HCT 33.6 (L) 01/25/2017   MCV 86.5 01/25/2017   PLT 337 01/25/2017    Assessment / Plan: A: Term pregnancy 2. GBS pos on ABX coverage 3. Cat 1 strip (formerly CAt 2 on Pitocin) 4. IOL for poorly controlled A2GDM P:   Labor: Pitocin off for Cat 2 strip which has now recovered Preeclampsia:none Fetal Wellbeing: Cat 1 now, continue to observe closely Pain Control:  Epidural  Anticipated MOD: Disc poss of LTCS due to Cat 2 strip. Pt is aware that fetus may not tolerate further labor. Will try to proceed with Pitocin. Advised pt that we will check her again in 2 hours and if no progress, will call Dr Karleen Hampshire. Pt and family are discussing poss LTCS and aware that pt may be a candidate. Currently, FHR is Cat 1, occas variable to 120 so Cat 2 on those occasions. No late decels and will continue to restart Pitocin to give a chance of a vag delivery. If baby does not tolerate, we will cut the Pitocin off. 2nd OR team would have to be called right now as a LTCS is starting. Once they are finished, we will proceed with vag exam at 4:30am.   Sharee Pimple 01/28/2017, 2:48 AM

## 2017-01-28 NOTE — Progress Notes (Signed)
Sumiya S Clougherty is a 21 y.o. G1P0 at [redacted]w[redacted]d for IOL for poorly controlled A2GDM.  Subjective: "I am comfortable"  Objective: BP 133/64   Pulse 99   Temp 98.1 F (36.7 C) (Oral)   Resp 18   Ht 5\' 4"  (1.626 m)   Wt 121.6 kg (268 lb)   LMP 05/04/2016 (Approximate)   SpO2 96%   BMI 46.00 kg/m  I/O last 3 completed shifts: In: 5145.8 [I.V.:5145.8] Out: -  Total I/O In: 2834.5 [I.V.:2634.5; IV Piggyback:200] Out: 500 [Urine:500]  FHT: 150, Cat 1, +accels, no decels  UC: irreg, difficulty in getting a pattern with Pitocin after cutting it off SVE:   Dilation: 5 Effacement (%): 90 Station: -2, -1 Exam by:: CYetta Barre CNM  Labs: Lab Results  Component Value Date   WBC 9.6 01/25/2017   HGB 11.9 (L) 01/25/2017   HCT 33.6 (L) 01/25/2017   MCV 86.5 01/25/2017   PLT 337 01/25/2017    Assessment / Plan: 1. Arrest of descent 2. Failed iOL P: Called Dr TJSchermerhorn and aware of no progress x 4 hours and difficulty with fetal tolerance with Pitocin and losing pattern and having to restart without getting back into an effective labor pattern. [facility] LTCS called at 0555. Family and pt informed of risks, benefits and alternatives including infection, bleeding, damage to internal organs and risks of anesthesia. Complications discussed include DVT, PE, fetal intolerance to glucose and temperature stabilization.   Sharee Pimple 01/28/2017, 4:54 AM

## 2017-01-28 NOTE — Discharge Summary (Addendum)
Obstetric Discharge Summary Reason for Admission: induction of labor, ID A2 GDM  Prenatal Procedures: none Intrapartum Procedures: cesarean: low cervical, transverse, APA Postpartum Procedures: none Complications-Operative and Postpartum: none Hemoglobin  Date Value Ref Range Status  01/29/2017 10.0 (L) 12.0 - 16.0 g/dL Final   HCT  Date Value Ref Range Status  01/29/2017 27.3 (L) 35.0 - 47.0 % Final    Physical Exam:  General: alert and cooperative Lochia: appropriate Uterine Fundus: firm Incision: healing well DVT Evaluation: No evidence of DVT seen on physical exam.  Discharge Diagnoses: Active phase arrest 3 day induction  Discharge Information: Date: 01/30/2017 Activity: pelvic rest Diet: routine and ADA diet  Medications: Ibuprofen, Colace and Percocet Condition: stable Instructions: refer to practice specific booklet Discharge to: home  Received varicella prior to discharge Newborn Data: Live born female  "Georgina Snell" Bottle feeding Birth Weight: 7 lb (3175 g) APGAR: 8, 9  Home with mother.  @[email or web address] (Error - No data available.)@ Allergies as of 01/30/2017   No Known Allergies     Medication List    STOP taking these medications   amoxicillin 875 MG tablet Commonly known as:  AMOXIL   insulin NPH Human 100 UNIT/ML injection Commonly known as:  HUMULIN N,NOVOLIN N   insulin NPH-regular Human (70-30) 100 UNIT/ML injection Commonly known as:  NOVOLIN 70/30     TAKE these medications   ascorbic acid 250 MG tablet Commonly known as:  VITAMIN C Take 1 tablet (250 mg total) by mouth 2 (two) times daily with a meal. Take with iron for anemia   docusate sodium 100 MG capsule Commonly known as:  COLACE Take 1 capsule (100 mg total) by mouth daily as needed for mild constipation.   ferrous sulfate 325 (65 FE) MG tablet Take 1 tablet (325 mg total) by mouth 2 (two) times daily with a meal. For anemia, take with Vitamin C   ibuprofen 800 MG  tablet Commonly known as:  ADVIL,MOTRIN Take 1 tablet (800 mg total) by mouth every 8 (eight) hours as needed for moderate pain or cramping.   IRON PO Take 1 tablet by mouth daily.   multivitamin-prenatal 27-0.8 MG Tabs tablet Take 1 tablet by mouth daily at 12 noon.   oxyCODONE-acetaminophen 5-325 MG tablet Commonly known as:  PERCOCET Take 1-2 tablets by mouth every 6 (six) hours as needed for severe pain.       Christeen Douglas 01/30/2017, 10:36 AM

## 2017-01-28 NOTE — Anesthesia Post-op Follow-up Note (Signed)
  Anesthesia Pain Follow-up Note  Patient: NEHIR QIU  Day #: 1  Date of Follow-up: 01/28/2017 Time: 7:30 AM  Last Vitals:  Vitals:   01/28/17 0712 01/28/17 0713  BP:    Pulse: 92 93  Resp: (!) 22 (!) 22  Temp:      Level of Consciousness: alert  Pain: 10 /10; patient receiving fentanyl at time of postop visit  Side Effects:None  Catheter Site Exam:clean, dry, no drainage     Plan: D/C from anesthesia care at surgeon's request  Karoline Caldwell

## 2017-01-28 NOTE — Transfer of Care (Signed)
Immediate Anesthesia Transfer of Care Note  Patient: Julie [name]  Procedure(s) Performed: Procedure(s): CESAREAN SECTION (N/A)  Patient Location: PACU  Anesthesia Type:Epidural  Level of Consciousness: awake, alert , oriented and patient cooperative  Airway & Oxygen Therapy: Patient Spontanous Breathing  Post-op Assessment: Report given to RN and Post -op Vital signs reviewed and stable  Post vital signs: Reviewed and stable  Last Vitals:  Vitals:   01/28/17 0415 01/28/17 0451  BP: 133/64   Pulse: 99   Resp:    Temp:  36.7 C    Last Pain:  Vitals:   01/28/17 0451  TempSrc: Oral  PainSc:       Patients Stated Pain Goal: 0 (01/27/17 1927)  Complications: No apparent anesthesia complications

## 2017-01-28 NOTE — Progress Notes (Signed)
Patient ID: Julie [name], female   DOB: 04-Apr-1996, 21 y.o.   MRN: [id number] Despite adequate MVU on pitocin pt has not progressed past 5 cm .  P have spoken to the pt regarding primary LTCS . Risks of the procedure have been discussed with the pt . All questions answered .  Ready to proceed

## 2017-01-28 NOTE — Brief Op Note (Signed)
01/25/2017 - 01/28/2017  6:36 AM  PATIENT:  Julie [name]  21 y.o. female  PRE-OPERATIVE DIAGNOSIS:  Active phase arrest , failed induction  POST-OPERATIVE DIAGNOSIS:same as above PROCEDURE:  Procedure(s): CESAREAN SECTION (N/A) Primary LTCS  SURGEON:  Surgeon(s) and Role:    Suzy Bouchard, MD - Primary  PHYSICIAN ASSISTANT:Caron Yetta Barre CNM   ASSISTANTS: none none  ANESTHESIA:   epidural  EBL:  500 cc, iof 1100 cc, uo 50 cc BLOOD ADMINISTERED:none  DRAINS: Urinary Catheter (Foley)   LOCAL MEDICATIONS USED:  NONE  SPECIMEN:  No Specimen  DISPOSITION OF SPECIMEN:  N/A  COUNTS:  YES  TOURNIQUET:  * No tourniquets in log *  DICTATION: .Other Dictation: Dictation Number verbal   PLAN OF CARE: Admit to inpatient   PATIENT DISPOSITION:  PACU - hemodynamically stable.   Delay start of Pharmacological VTE agent (>24hrs) due to surgical blood loss or risk of bleeding: not applicable

## 2017-01-28 NOTE — Progress Notes (Signed)
Inpatient Diabetes Program Recommendations  AACE/ADA: New Consensus Statement on Inpatient Glycemic Control (2015)  Target Ranges:  Prepandial:   less than 140 mg/dL      Peak postprandial:   less than 180 mg/dL (1-2 hours)      Critically ill patients:  140 - 180 mg/dL   Results for Julie, [name] (MRN [id number]) as of 01/28/2017 07:54  Ref. Range 01/27/2017 01:03 01/27/2017 05:18 01/27/2017 09:17 01/27/2017 12:16 01/27/2017 16:32 01/27/2017 20:13 01/28/2017 00:34 01/28/2017 04:34  Glucose-Capillary Latest Ref Range: 65 - 99 mg/dL 87 89 72 66 81 67 84 532 (H)   Review of Glycemic Control  Diabetes history: GDM Outpatient Diabetes medications: NPH 46 units AM and 26 units PM.  Regular insulin 20 units with breakfast and 12 units with supper Current orders for Inpatient glycemic control: Novolog 16 units TID with meals, Novolog 0-15 units TID with meals, Novolog 0-15 units Q4H  Inpatient Diabetes Program Recommendations: Insulin - Meal Coverage: Please discontinue Novolog 16 units TID with meals. Insulin-Correction: Please discontinue Novolog 0-15 units Q4H and continue with Novolog 0-15 units TID with meals.  Note: In reviewing chart, noted patient had GDM and no DM prior to pregnancy. Since patient has delivered, anticipate glucose will normalize without any needed insulin. Recommend discontinuing Novolog 16 units TID meal coverage and continue to monitor glucose and use Novolog correction scale if needed. Recommend patient follow up with PCP regarding glycemic control and rechecking A1C.  Thanks, Orlando Penner, RN, MSN, CDE Diabetes Coordinator Inpatient Diabetes Program [phone] (Team Pager from 8am to 5pm)

## 2017-01-28 NOTE — Anesthesia Postprocedure Evaluation (Signed)
Anesthesia Post Note  Patient: Julie [name]  Procedure(s) Performed: Procedure(s) (LRB): CESAREAN SECTION (N/A)  Patient location during evaluation: Mother Baby Anesthesia Type: Epidural Level of consciousness: awake, awake and alert and oriented Pain management: pain level not controlled (patient receiving fentanyl at time of postop visit) Vital Signs Assessment: post-procedure vital signs reviewed and stable Respiratory status: spontaneous breathing Cardiovascular status: blood pressure returned to baseline Postop Assessment: no signs of nausea or vomiting Anesthetic complications: no     Last Vitals:  Vitals:   01/28/17 0712 01/28/17 0713  BP:    Pulse: 92 93  Resp: (!) 22 (!) 22  Temp:      Last Pain:  Vitals:   01/28/17 0713  TempSrc:   PainSc: 10-Worst pain ever                 Karoline Caldwell

## 2017-01-28 NOTE — Anesthesia Post-op Follow-up Note (Cosign Needed)
Anesthesia QCDR form completed.        

## 2017-01-29 LAB — CBC
HCT: 27.3 % — ABNORMAL LOW (ref 35.0–47.0)
HEMOGLOBIN: 10 g/dL — AB (ref 12.0–16.0)
MCH: 31.7 pg (ref 26.0–34.0)
MCHC: 36.6 g/dL — AB (ref 32.0–36.0)
MCV: 86.6 fL (ref 80.0–100.0)
PLATELETS: 244 10*3/uL (ref 150–440)
RBC: 3.15 MIL/uL — ABNORMAL LOW (ref 3.80–5.20)
RDW: 13.7 % (ref 11.5–14.5)
WBC: 10.2 10*3/uL (ref 3.6–11.0)

## 2017-01-29 LAB — GLUCOSE, CAPILLARY
GLUCOSE-CAPILLARY: 71 mg/dL (ref 65–99)
GLUCOSE-CAPILLARY: 97 mg/dL (ref 65–99)

## 2017-01-29 NOTE — Progress Notes (Signed)
Subjective: Postpartum Day 1: Cesarean Delivery Patient reports no problems voiding.   Glucose values all good off insulin and on ADA diet  Objective: Vital signs in last 24 hours: Temp:  [97.6 F (36.4 C)-98.8 F (37.1 C)] 98.8 F (37.1 C) (02/02 0315) Pulse Rate:  [80-102] 100 (02/02 0315) Resp:  [16-20] 16 (02/02 0315) BP: (104-134)/(63-79) 134/79 (02/02 0315) SpO2:  [96 %-99 %] 98 % (02/02 0315)  Physical Exam:  General: alert and cooperative Lochia: appropriate Uterine Fundus: firm Incision: no significant drainage DVT Evaluation: No evidence of DVT seen on physical exam.   Recent Labs  01/29/17 0518  HGB 10.0*  HCT 27.3*    Assessment/Plan: Status post Cesarean section. Doing well postoperatively.  Continue current care.  Julie [name] 01/29/2017, 9:46 AM

## 2017-01-29 NOTE — Op Note (Signed)
NAMESHANDRIA, CLINCH NO.:  [id number]  MEDICAL RECORD NO.:  [id number]  LOCATION:                                 FACILITY:  PHYSICIAN:  Jennell Corner, MDDATE OF BIRTH:  Oct 06, 1996  DATE OF PROCEDURE: DATE OF DISCHARGE:                              OPERATIVE REPORT   PREOPERATIVE DIAGNOSIS: 1. 38+ 4 weeks estimated gestational age. 2. Insulin-dependent gestational diabetic. 3. Active phase arrest.  POSTOPERATIVE DIAGNOSIS: 1. 38+ 4 weeks estimated gestational age. 2. Insulin-dependent gestational diabetic. 3. Active phase arrest.  PROCEDURE:  Primary low transverse cesarean section.  SURGEON:  Jennell Corner, MD.  FIRST ASSISTANT:  Deatra Robinson, Nurse midwife.  ANESTHESIA:  Surgical dosing of epidural.  INDICATION:  21 year old gravida 1, para 0 patient at 38+ 4 weeks estimated gestational age, was admitted on January 25, 2017, for an induction given her suboptimal control of her diabetes throughout pregnancy.  The patient progressed to 5 cm despite adequate contraction pattern and did not progress past 5 cm.  DESCRIPTION OF PROCEDURE:  After adequate surgical dosing of continuous lumbar epidural, the patient was placed in dorsal supine position, hip rolled on the right side.  The patient was prepped and draped in normal sterile fashion.  The patient did receive 2 g IV Ancef and 500 mg IV azithromycin prior to commencement of the case.  A time-out was performed.  A Pfannenstiel incision was then made 2 fingerbreadths above the symphysis pubis.  Sharp dissection was used to identify the fascia. Fascia was opened in the midline and opened in a transverse fashion. The superior aspect of the fascia was grasped with Kocher clamps and the recti muscles were dissected free.  Inferior aspect of the fascia was grasped with Kocher clamps and pyramidalis muscle was dissected free. Entry into the peritoneal cavity was accomplished sharply.   The vesicular uterine peritoneal fold was identified and a bladder flap was created, and the bladder was reflected inferiorly.  A low transverse uterine incision was made.  Upon entry into the endometrial cavity, clear fluid resulted.  Fetal head was brought to the incision.  A loose nuchal cord was reduced and shoulders and body were delivered without difficulty.  Vigorous female was then dried on the abdomen for 60 seconds. Apgar scores 8 and 9.  Weight 7 pounds 0 ounces.  Infant was passed to nursery staff.  Placenta was manually delivered.  The uterus was exteriorized.  The endometrial cavity was wiped clean with laparotomy tape, and the uterine incision was closed with 1 chromic suture in a running locking fashion with good approximation of edges.  Good hemostasis.  The patient did have a small left paratubal cyst which was drained with the Bovie.  The posterior cul-de-sac was irrigated and suctioned, and the uterus was placed back into the abdominal cavity. Ovaries and fallopian tubes appeared normal otherwise.  The uterine incision again appeared hemostatic.  Pericolic gutters were wiped clean with laparotomy tape, and Interceed was placed over the uterine incision in a T-shaped fashion.  Fascia was then closed with 0 Vicryl in a running nonlocking fashion, good approximation of edges.  Subcutaneous tissues were irrigated and bovied for hemostasis.  Subcutaneous tissues approximated 3 cm.  Therefore, the dead space and subcutaneous was closed with a running 3-0 chromic suture.  The skin was reapproximated with Insorb absorbable staples with good cosmetic effect.  There were no complications.  The patient tolerated the procedure well.  INTRAOPERATIVE FLUIDS:  1100 mL.  URINE OUTPUT:  50 mL.  ESTIMATED BLOOD LOSS:  500 mL.  The patient was taken to recovery room in good condition.    ______________________________ Jennell Corner,  MD   ______________________________ Jennell Corner, MD    TS/MEDQ  D:  01/28/2017  T:  01/29/2017  Job:  [id number]

## 2017-01-30 ENCOUNTER — Emergency Department: Payer: Medicaid Other

## 2017-01-30 ENCOUNTER — Encounter: Payer: Self-pay | Admitting: Radiology

## 2017-01-30 ENCOUNTER — Inpatient Hospital Stay
Admission: EM | Admit: 2017-01-30 | Discharge: 2017-02-01 | DRG: 776 | Disposition: A | Discharge status: Home / Self Care | Payer: Medicaid Other | Attending: Internal Medicine | Admitting: Internal Medicine

## 2017-01-30 DIAGNOSIS — Z818 Family history of other mental and behavioral disorders: Secondary | ICD-10-CM

## 2017-01-30 DIAGNOSIS — J810 Acute pulmonary edema: Secondary | ICD-10-CM

## 2017-01-30 DIAGNOSIS — E039 Hypothyroidism, unspecified: Secondary | ICD-10-CM | POA: Diagnosis present

## 2017-01-30 DIAGNOSIS — R0602 Shortness of breath: Secondary | ICD-10-CM

## 2017-01-30 DIAGNOSIS — O99285 Endocrine, nutritional and metabolic diseases complicating the puerperium: Secondary | ICD-10-CM | POA: Diagnosis present

## 2017-01-30 DIAGNOSIS — J918 Pleural effusion in other conditions classified elsewhere: Secondary | ICD-10-CM | POA: Diagnosis present

## 2017-01-30 DIAGNOSIS — I340 Nonrheumatic mitral (valve) insufficiency: Secondary | ICD-10-CM | POA: Diagnosis present

## 2017-01-30 DIAGNOSIS — Z833 Family history of diabetes mellitus: Secondary | ICD-10-CM

## 2017-01-30 DIAGNOSIS — D62 Acute posthemorrhagic anemia: Secondary | ICD-10-CM | POA: Diagnosis present

## 2017-01-30 DIAGNOSIS — Z87891 Personal history of nicotine dependence: Secondary | ICD-10-CM

## 2017-01-30 DIAGNOSIS — O9089 Other complications of the puerperium, not elsewhere classified: Principal | ICD-10-CM | POA: Diagnosis present

## 2017-01-30 DIAGNOSIS — I272 Pulmonary hypertension, unspecified: Secondary | ICD-10-CM | POA: Diagnosis present

## 2017-01-30 DIAGNOSIS — I5031 Acute diastolic (congestive) heart failure: Secondary | ICD-10-CM | POA: Diagnosis present

## 2017-01-30 DIAGNOSIS — I501 Left ventricular failure: Secondary | ICD-10-CM | POA: Diagnosis present

## 2017-01-30 DIAGNOSIS — O24434 Gestational diabetes mellitus in the puerperium, insulin controlled: Secondary | ICD-10-CM | POA: Diagnosis present

## 2017-01-30 HISTORY — DX: History of uterine scar from previous surgery: Z98.891

## 2017-01-30 LAB — BASIC METABOLIC PANEL
Anion gap: 8 (ref 5–15)
BUN: 12 mg/dL (ref 6–20)
CALCIUM: 8.2 mg/dL — AB (ref 8.9–10.3)
CO2: 23 mmol/L (ref 22–32)
CREATININE: 0.56 mg/dL (ref 0.44–1.00)
Chloride: 105 mmol/L (ref 101–111)
GFR calc non Af Amer: >60 mL/min (ref >60)
Glucose, Bld: 98 mg/dL (ref 65–99)
Potassium: 3.5 mmol/L (ref 3.5–5.1)
Sodium: 136 mmol/L (ref 135–145)

## 2017-01-30 LAB — CBC WITH DIFFERENTIAL/PLATELET
BASOS PCT: 1 %
Basophils Absolute: 0.1 10*3/uL (ref 0–0.1)
EOS ABS: 0.3 10*3/uL (ref 0–0.7)
Eosinophils Relative: 3 %
HCT: 26.2 % — ABNORMAL LOW (ref 35.0–47.0)
Hemoglobin: 9.5 g/dL — ABNORMAL LOW (ref 12.0–16.0)
Lymphocytes Relative: 24 %
Lymphs Abs: 2.1 10*3/uL (ref 1.0–3.6)
MCH: 31.1 pg (ref 26.0–34.0)
MCHC: 36.2 g/dL — AB (ref 32.0–36.0)
MCV: 85.9 fL (ref 80.0–100.0)
MONO ABS: 0.5 10*3/uL (ref 0.2–0.9)
Monocytes Relative: 5 %
Neutro Abs: 6 10*3/uL (ref 1.4–6.5)
Neutrophils Relative %: 67 %
Platelets: 315 10*3/uL (ref 150–440)
RBC: 3.05 MIL/uL — ABNORMAL LOW (ref 3.80–5.20)
RDW: 13.1 % (ref 11.5–14.5)
WBC: 8.9 10*3/uL (ref 3.6–11.0)

## 2017-01-30 LAB — BRAIN NATRIURETIC PEPTIDE: B Natriuretic Peptide: 190 pg/mL — ABNORMAL HIGH (ref 0.0–100.0)

## 2017-01-30 LAB — GLUCOSE, CAPILLARY: Glucose-Capillary: 76 mg/dL (ref 65–99)

## 2017-01-30 LAB — TROPONIN I: Troponin I: <0.03 ng/mL (ref <0.03)

## 2017-01-30 MED ORDER — FUROSEMIDE 10 MG/ML IJ SOLN
40.0000 mg | Freq: Once | INTRAMUSCULAR | Status: AC
  Administered 2017-01-30: 40 mg via INTRAVENOUS
  Filled 2017-01-30: qty 4

## 2017-01-30 MED ORDER — ASCORBIC ACID 250 MG PO TABS: 250.0000 mg | ORAL_TABLET | Freq: Two times a day (BID) | ORAL | 3 refills | Status: AC

## 2017-01-30 MED ORDER — IOPAMIDOL (ISOVUE-370) INJECTION 76%
75.0000 mL | Freq: Once | INTRAVENOUS | Status: AC | PRN
  Administered 2017-01-30: 75 mL via INTRAVENOUS

## 2017-01-30 MED ORDER — IBUPROFEN 800 MG PO TABS: 800.0000 mg | ORAL_TABLET | Freq: Three times a day (TID) | ORAL | 0 refills | Status: AC | PRN

## 2017-01-30 MED ORDER — VARICELLA VIRUS VACCINE LIVE 1350 PFU/0.5ML ~~LOC~~ INJ
0.5000 mL | INJECTION | Freq: Once | SUBCUTANEOUS | Status: AC
Start: 2017-01-30 — End: 2017-01-30
  Administered 2017-01-30: 0.5 mL via SUBCUTANEOUS
  Filled 2017-01-30: qty 0.5

## 2017-01-30 MED ORDER — FERROUS SULFATE 325 (65 FE) MG PO TABS: 325.0000 mg | ORAL_TABLET | Freq: Two times a day (BID) | ORAL | 3 refills | Status: DC

## 2017-01-30 MED ORDER — OXYCODONE-ACETAMINOPHEN 5-325 MG PO TABS: 1.0000 | ORAL_TABLET | Freq: Four times a day (QID) | ORAL | 0 refills | Status: DC | PRN

## 2017-01-30 MED ORDER — DOCUSATE SODIUM 100 MG PO CAPS: 100.0000 mg | ORAL_CAPSULE | Freq: Every day | ORAL | 3 refills | Status: DC | PRN

## 2017-01-30 NOTE — Progress Notes (Signed)
Discharge instructions complete and prescriptions given. Patient verbalizes understanding of teaching. Patient discharged home at 1200.

## 2017-01-30 NOTE — ED Notes (Signed)
Pt up to restroom. Family at the bedside assisting pt as needed. Pt tearful. Awaiting admission.

## 2017-01-30 NOTE — ED Notes (Signed)
Pt discharged today from [facility] following C-section on 01/28/17; pt presents with c/o shortness of breath; assisted to wheelchair upon arrival;

## 2017-01-30 NOTE — ED Triage Notes (Addendum)
Report discharged after c-section today.  Reports woke from nap with shortness of breath and increased swelling.  Lungs clear bilaterally in triage.

## 2017-01-30 NOTE — ED Provider Notes (Signed)
[facility] Emergency Department Provider Note   ____________________________________________   I have reviewed the triage vital signs and the nursing notes.   HISTORY  Chief Complaint Shortness of Breath   History limited by: Not Limited   HPI Julie [name] is a 21 y.o. female who presents to the emergency department today because of shortness of breath. The patient underwent a C-section 2 days ago and left the hospital today. She states this afternoon she was napping and then woke up short of breath. She has also had some pain in her back. The patient has also noticed swelling diffusely throughout her body and her legs, arms and face. No fevers.   Past Medical History:  Diagnosis Date  . Gestational diabetes   . ZOXWRUEA(540.9)     Patient Active Problem List   Diagnosis Date Noted  . Post-operative state 01/28/2017  . Gestational diabetes 01/25/2017  . Supervision of high risk pregnancy in third trimester 12/24/2016  . Insulin dependent gestational diabetes mellitus (GDM), antepartum (HCC) 12/11/2016  . Gestational diabetes mellitus 12/07/2016  . First trimester screening 08/06/2016  . Migraine with aura, without mention of intractable migraine without mention of status migrainosus 07/17/2013    Past Surgical History:  Procedure Laterality Date  . CESAREAN SECTION N/A 01/28/2017   Procedure: CESAREAN SECTION;  Surgeon: Suzy Bouchard, MD;  Location: [facility] ORS;  Service: Obstetrics;  Laterality: N/A;  . TONSILLECTOMY      Prior to Admission medications   Medication Sig Start Date End Date Taking? Authorizing Provider  ascorbic acid (VITAMIN C) 250 MG tablet Take 1 tablet (250 mg total) by mouth 2 (two) times daily with a meal. Take with iron for anemia 01/30/17 03/31/17  Christeen Douglas, MD  docusate sodium (COLACE) 100 MG capsule Take 1 capsule (100 mg total) by mouth daily as needed for mild constipation. 01/30/17   Christeen Douglas, MD   ferrous sulfate 325 (65 FE) MG tablet Take 1 tablet (325 mg total) by mouth 2 (two) times daily with a meal. For anemia, take with Vitamin C 01/30/17 03/31/17  Christeen Douglas, MD  ibuprofen (ADVIL,MOTRIN) 800 MG tablet Take 1 tablet (800 mg total) by mouth every 8 (eight) hours as needed for moderate pain or cramping. 01/30/17 02/06/17  Christeen Douglas, MD  IRON PO Take 1 tablet by mouth daily.    Historical Provider, MD  oxyCODONE-acetaminophen (PERCOCET) 5-325 MG tablet Take 1-2 tablets by mouth every 6 (six) hours as needed for severe pain. 01/30/17   Christeen Douglas, MD  Prenatal Vit-Fe Fumarate-FA (MULTIVITAMIN-PRENATAL) 27-0.8 MG TABS tablet Take 1 tablet by mouth daily at 12 noon.    Historical Provider, MD    Allergies Patient has no known allergies.  Family History  Problem Relation Age of Onset  . Anxiety disorder Mother   . Depression Mother   . Diabetes Mother   . Migraines Maternal Grandmother   . Anxiety disorder Maternal Grandmother   . Depression Maternal Grandmother   . Migraines Maternal Aunt   . Migraines Maternal Uncle     Social History Social History  Substance Use Topics  . Smoking status: Former Smoker    Types: Cigarettes  . Smokeless tobacco: Never Used     Comment: quit since pregnancy  . Alcohol use No    Review of Systems  Constitutional: Negative for fever. Cardiovascular: Negative for chest pain. Respiratory: Positive for shortness of breath. Gastrointestinal: Negative for abdominal pain, vomiting and diarrhea. Genitourinary: Negative for dysuria. Musculoskeletal:  Positive for back pain. Positive for lower extremity swelling. Skin: Negative for rash. Neurological: Negative for headaches, focal weakness or numbness.  10-point ROS otherwise negative.  ____________________________________________   PHYSICAL EXAM:  VITAL SIGNS: ED Triage Vitals  Enc Vitals Group     BP 01/30/17 2055 (!) 142/88     Pulse Rate 01/30/17 2055 91     Resp  01/30/17 2055 (!) 22     Temp 01/30/17 2055 98.4 F (36.9 C)     Temp Source 01/30/17 2055 Oral     SpO2 01/30/17 2055 95 %     Weight --      Height --      Head Circumference --      Peak Flow --      Pain Score 01/30/17 2102 8   Constitutional: Alert and oriented. Mild respiratory distress.  Eyes: Conjunctivae are normal. Normal extraocular movements. ENT   Head: Normocephalic and atraumatic.   Nose: No congestion/rhinnorhea.   Mouth/Throat: Mucous membranes are moist.   Neck: No stridor. Hematological/Lymphatic/Immunilogical: No cervical lymphadenopathy. Cardiovascular: Normal rate, regular rhythm.  No murmurs, rubs, or gallops.  Respiratory: Mild respiratory distress with increased rate of breathing. Gastrointestinal: Soft and non tender. No rebound. No guarding.  Genitourinary: Deferred Musculoskeletal: Normal range of motion in all extremities. Bilateral lower extremity edema.  Neurologic:  Normal speech and language. No gross focal neurologic deficits are appreciated.  Skin:  Skin is warm, dry and intact. No rash noted. Psychiatric: Mood and affect are normal. Speech and behavior are normal. Patient exhibits appropriate insight and judgment.  ____________________________________________    LABS (pertinent positives/negatives)  Labs Reviewed  CBC WITH DIFFERENTIAL/PLATELET - Abnormal; Notable for the following:       Result Value   RBC 3.05 (*)    Hemoglobin 9.5 (*)    HCT 26.2 (*)    MCHC 36.2 (*)    All other components within normal limits  BASIC METABOLIC PANEL - Abnormal; Notable for the following:    Calcium 8.2 (*)    All other components within normal limits  BRAIN NATRIURETIC PEPTIDE - Abnormal; Notable for the following:    B Natriuretic Peptide 190.0 (*)    All other components within normal limits  TROPONIN I     ____________________________________________   EKG  None  ____________________________________________     RADIOLOGY  CT angio IMPRESSION: Negative for pulmonary embolism. There is interstitial and alveolar fluid as well as pleural effusions, with appearances most consistent with fluid overload or CHF.  CXR IMPRESSION: Diffuse interstitial thickening, likely fluid. No alveolar edema or significant pleural effusion.  ____________________________________________   PROCEDURES  Procedures  ____________________________________________   INITIAL IMPRESSION / ASSESSMENT AND PLAN / ED COURSE  Pertinent labs & imaging results that were available during my care of the patient were reviewed by me and considered in my medical decision making (see chart for details).  Patient presents to the emergency department today because of concerns for shortness of breath that started this afternoon. Exam is concerning for diffuse edema. Additionally cxr showed pulmonary edema. I did obtain a CT to evaluate for pulmonary embolism. This was negative for embolism but did show edema. Did contact Dr. Dalbert Garnet  With ob/gyn who will evaluate the patient . Patient was written for IV lasix.   ____________________________________________   FINAL CLINICAL IMPRESSION(S) / ED DIAGNOSES  Final diagnoses:  Acute pulmonary edema (HCC)  SOB (shortness of breath)     Note: This dictation was prepared with Reubin Milan  dictation. Any transcriptional errors that result from this process are unintentional     Phineas Semen, MD 01/31/17 [phone]

## 2017-01-31 ENCOUNTER — Inpatient Hospital Stay (HOSPITAL_COMMUNITY)
Admit: 2017-01-31 | Discharge: 2017-01-31 | Disposition: A | Discharge status: Home / Self Care | Payer: Medicaid Other | Attending: Obstetrics and Gynecology | Admitting: Obstetrics and Gynecology

## 2017-01-31 ENCOUNTER — Encounter: Payer: Self-pay | Admitting: *Deleted

## 2017-01-31 ENCOUNTER — Other Ambulatory Visit: Payer: Self-pay

## 2017-01-31 DIAGNOSIS — I340 Nonrheumatic mitral (valve) insufficiency: Secondary | ICD-10-CM | POA: Diagnosis present

## 2017-01-31 DIAGNOSIS — I501 Left ventricular failure: Secondary | ICD-10-CM

## 2017-01-31 DIAGNOSIS — I429 Cardiomyopathy, unspecified: Secondary | ICD-10-CM | POA: Diagnosis not present

## 2017-01-31 DIAGNOSIS — Z87891 Personal history of nicotine dependence: Secondary | ICD-10-CM | POA: Diagnosis not present

## 2017-01-31 DIAGNOSIS — I272 Pulmonary hypertension, unspecified: Secondary | ICD-10-CM | POA: Diagnosis present

## 2017-01-31 DIAGNOSIS — Z833 Family history of diabetes mellitus: Secondary | ICD-10-CM | POA: Diagnosis not present

## 2017-01-31 DIAGNOSIS — O24434 Gestational diabetes mellitus in the puerperium, insulin controlled: Secondary | ICD-10-CM | POA: Diagnosis present

## 2017-01-31 DIAGNOSIS — J918 Pleural effusion in other conditions classified elsewhere: Secondary | ICD-10-CM | POA: Diagnosis present

## 2017-01-31 DIAGNOSIS — R0602 Shortness of breath: Secondary | ICD-10-CM | POA: Diagnosis not present

## 2017-01-31 DIAGNOSIS — E039 Hypothyroidism, unspecified: Secondary | ICD-10-CM | POA: Diagnosis present

## 2017-01-31 DIAGNOSIS — D62 Acute posthemorrhagic anemia: Secondary | ICD-10-CM | POA: Diagnosis present

## 2017-01-31 DIAGNOSIS — Z818 Family history of other mental and behavioral disorders: Secondary | ICD-10-CM | POA: Diagnosis not present

## 2017-01-31 DIAGNOSIS — O99285 Endocrine, nutritional and metabolic diseases complicating the puerperium: Secondary | ICD-10-CM | POA: Diagnosis present

## 2017-01-31 DIAGNOSIS — O9089 Other complications of the puerperium, not elsewhere classified: Secondary | ICD-10-CM | POA: Diagnosis present

## 2017-01-31 DIAGNOSIS — I5031 Acute diastolic (congestive) heart failure: Secondary | ICD-10-CM | POA: Diagnosis not present

## 2017-01-31 LAB — COMPREHENSIVE METABOLIC PANEL
ALT: 24 U/L (ref 14–54)
AST: 59 U/L — AB (ref 15–41)
Albumin: 2.7 g/dL — ABNORMAL LOW (ref 3.5–5.0)
Alkaline Phosphatase: 102 U/L (ref 38–126)
Anion gap: 9 (ref 5–15)
BILIRUBIN TOTAL: 0.6 mg/dL (ref 0.3–1.2)
BUN: 11 mg/dL (ref 6–20)
CO2: 26 mmol/L (ref 22–32)
CREATININE: 0.66 mg/dL (ref 0.44–1.00)
Calcium: 8.6 mg/dL — ABNORMAL LOW (ref 8.9–10.3)
Chloride: 105 mmol/L (ref 101–111)
GFR calc Af Amer: >60 mL/min (ref >60)
GLUCOSE: 112 mg/dL — AB (ref 65–99)
Potassium: 3.7 mmol/L (ref 3.5–5.1)
Sodium: 140 mmol/L (ref 135–145)
TOTAL PROTEIN: 6.9 g/dL (ref 6.5–8.1)

## 2017-01-31 LAB — CBC
HEMATOCRIT: 28.8 % — AB (ref 35.0–47.0)
Hemoglobin: 10.1 g/dL — ABNORMAL LOW (ref 12.0–16.0)
MCH: 30.6 pg (ref 26.0–34.0)
MCHC: 35.2 g/dL (ref 32.0–36.0)
MCV: 86.8 fL (ref 80.0–100.0)
Platelets: 336 10*3/uL (ref 150–440)
RBC: 3.32 MIL/uL — AB (ref 3.80–5.20)
RDW: 13.4 % (ref 11.5–14.5)
WBC: 9.2 10*3/uL (ref 3.6–11.0)

## 2017-01-31 LAB — TROPONIN I: TROPONIN I: 0.04 ng/mL — AB (ref <0.03)

## 2017-01-31 LAB — LIPID PANEL
Cholesterol: 148 mg/dL (ref 0–169)
HDL: 45 mg/dL (ref >40)
LDL Cholesterol: 65 mg/dL (ref 0–99)
Total CHOL/HDL Ratio: 3.3 RATIO
Triglycerides: 191 mg/dL — ABNORMAL HIGH (ref <150)
VLDL: 38 mg/dL (ref 0–40)

## 2017-01-31 LAB — BASIC METABOLIC PANEL
ANION GAP: 9 (ref 5–15)
BUN: 10 mg/dL (ref 6–20)
CO2: 25 mmol/L (ref 22–32)
Calcium: 8.1 mg/dL — ABNORMAL LOW (ref 8.9–10.3)
Chloride: 103 mmol/L (ref 101–111)
Creatinine, Ser: 0.56 mg/dL (ref 0.44–1.00)
GFR calc Af Amer: >60 mL/min (ref >60)
GLUCOSE: 87 mg/dL (ref 65–99)
POTASSIUM: 3.5 mmol/L (ref 3.5–5.1)
SODIUM: 137 mmol/L (ref 135–145)

## 2017-01-31 LAB — TSH: TSH: 5.18 u[IU]/mL — AB (ref 0.350–4.500)

## 2017-01-31 LAB — URIC ACID: URIC ACID, SERUM: 6.2 mg/dL (ref 2.3–6.6)

## 2017-01-31 LAB — ECHOCARDIOGRAM COMPLETE
HEIGHTINCHES: 65 in
WEIGHTICAEL: 4294.4 [oz_av]

## 2017-01-31 MED ORDER — ZOLPIDEM TARTRATE 5 MG PO TABS: 5.0000 mg | ORAL_TABLET | Freq: Every evening | ORAL | Status: DC | PRN

## 2017-01-31 MED ORDER — FUROSEMIDE 10 MG/ML IJ SOLN
20.0000 mg | Freq: Two times a day (BID) | INTRAMUSCULAR | Status: DC
  Administered 2017-01-31 – 2017-02-01 (×3): 20 mg via INTRAVENOUS
  Filled 2017-01-31 (×3): qty 2

## 2017-01-31 MED ORDER — BISACODYL 5 MG PO TBEC: 5.0000 mg | DELAYED_RELEASE_TABLET | Freq: Every day | ORAL | Status: DC | PRN

## 2017-01-31 MED ORDER — MAGNESIUM HYDROXIDE 400 MG/5ML PO SUSP: 30.0000 mL | Freq: Every day | ORAL | Status: DC | PRN

## 2017-01-31 MED ORDER — SODIUM CHLORIDE 0.9 % IV SOLN: 250.0000 mL | INTRAVENOUS | Status: DC | PRN

## 2017-01-31 MED ORDER — IBUPROFEN 600 MG PO TABS
600.0000 mg | ORAL_TABLET | Freq: Four times a day (QID) | ORAL | Status: DC | PRN
  Administered 2017-01-31 – 2017-02-01 (×4): 600 mg via ORAL
  Filled 2017-01-31 (×5): qty 1

## 2017-01-31 MED ORDER — DOCUSATE SODIUM 100 MG PO CAPS
100.0000 mg | ORAL_CAPSULE | Freq: Two times a day (BID) | ORAL | Status: DC
  Administered 2017-01-31 (×2): 100 mg via ORAL
  Filled 2017-01-31 (×2): qty 1

## 2017-01-31 MED ORDER — SODIUM CHLORIDE 0.9% FLUSH
3.0000 mL | Freq: Two times a day (BID) | INTRAVENOUS | Status: DC
  Administered 2017-01-31 (×2): 3 mL via INTRAVENOUS

## 2017-01-31 MED ORDER — ONDANSETRON HCL 4 MG/2ML IJ SOLN: 4.0000 mg | Freq: Four times a day (QID) | INTRAMUSCULAR | Status: DC | PRN

## 2017-01-31 MED ORDER — MAGNESIUM CITRATE PO SOLN: 1.0000 | Freq: Once | ORAL | Status: DC | PRN

## 2017-01-31 MED ORDER — LEVOTHYROXINE SODIUM 50 MCG PO TABS: 25.0000 ug | ORAL_TABLET | Freq: Every day | ORAL | Status: DC

## 2017-01-31 MED ORDER — SODIUM CHLORIDE 0.9% FLUSH: 3.0000 mL | INTRAVENOUS | Status: DC | PRN

## 2017-01-31 MED ORDER — ALUM & MAG HYDROXIDE-SIMETH 200-200-20 MG/5ML PO SUSP: 30.0000 mL | ORAL | Status: DC | PRN

## 2017-01-31 MED ORDER — OXYCODONE-ACETAMINOPHEN 5-325 MG PO TABS
1.0000 | ORAL_TABLET | ORAL | Status: DC | PRN
  Administered 2017-01-31: 1 via ORAL
  Filled 2017-01-31: qty 1

## 2017-01-31 MED ORDER — ONDANSETRON HCL 4 MG PO TABS: 4.0000 mg | ORAL_TABLET | Freq: Four times a day (QID) | ORAL | Status: DC | PRN

## 2017-01-31 MED ORDER — PRENATAL MULTIVITAMIN CH
1.0000 | ORAL_TABLET | Freq: Every day | ORAL | Status: DC
  Administered 2017-01-31: 1 via ORAL
  Filled 2017-01-31 (×3): qty 1

## 2017-01-31 NOTE — Consult Note (Signed)
Consult History and Physical   SERVICE: Obstetrics  Patient Name: Julie [name] Patient MRN:   [id number]  CC: Shortness of breath  HPI: Julie [name] is a 21 y.o. G1P1001 POD#2 from uncomplicated primary cesarean section presenting with acute shortness of breath that started earlier today. She is having pain in her mid back. She is not breast-feeding. She is not having incisional pain. She has no fever. She feels like she's panting, but has not noticed any heart palpitations. She also notes diffuse edema in her lower extremities, upper extremities and now on her face.  Her vaginal bleeding is minimal. Denies dysuria. Denies visual changes, right upper quadrant pain, and headache. She endorses anxiety about her shortness of breath. She has been voiding as normal.  She was discharged on postoperative day 2, this morning, in stable condition. She had edema in her lower extremities at that time, +2, that came to her knees, but this had been present for several weeks prior to delivery as well. Her edema worsened during the last day.  Her pregnancy was complicated by poorly controlled insulin-dependent gestational diabetes, for which she was induced for 3 days prior to her C-section. Since delivery, her sugars have been normal, and she is not needed insulin at home.  ED Course: She received 40 mg of IV IV Lasix in the emergency department. A CT angiogram was negative for pulmonary embolus. Her chest x-ray was clear except for interstitial fluid, and her EKG was normal sinus rhythm. Her BUN and creatinine were normal. She was mildly anemic with a hemoglobin of 9.5, but this is expected after pregnancy, several days of hospitalization fluids, and abdominal surgery with small blood loss. Her white blood cell count was normal, and she is afebrile.  Review of Systems: positives in bold GEN:   fevers, chills, weight changes, appetite changes, fatigue, night sweats HEENT:  HA, vision changes, hearing  loss, congestion, rhinorrhea, sinus pressure, dysphagia CV:   CP, palpitations PULM:  SOB, cough GI:  abd pain, N/V/D/C-appropriate incisional pain at her C-section. Fundus is firm and nontender. GU:  dysuria, urgency, frequency MSK:  arthralgias, myalgias, back pain, swelling SKIN:  rashes, color changes, pallor NEURO:  numbness, weakness, tingling, seizures, dizziness, tremors PSYCH:  depression, anxiety, behavioral problems, confusion  HEME/LYMPH:  easy bruising or bleeding ENDO:  heat/cold intolerance  Past Obstetrical History: OB History    Gravida Para Term Preterm AB Living   1 1 1     1    SAB TAB Ectopic Multiple Live Births         0 1      Past Gynecologic History: No LMP recorded.   Past Medical History: Past Medical History:  Diagnosis Date  . Gestational diabetes   . [id number](478.2)     Past Surgical History:   Past Surgical History:  Procedure Laterality Date  . CESAREAN SECTION N/A 01/28/2017   Procedure: CESAREAN SECTION;  Surgeon: Suzy Bouchard, MD;  Location: [facility] ORS;  Service: Obstetrics;  Laterality: N/A;  . TONSILLECTOMY      Family History:  family history includes Anxiety disorder in her maternal grandmother and mother; Depression in her maternal grandmother and mother; Diabetes in her mother; Migraines in her maternal aunt, maternal grandmother, and maternal uncle.  Social History:  Social History   Social History  . Marital status: Single    Spouse name: N/A  . Number of children: N/A  . Years of education: N/A   Occupational History  . Consulting civil engineer  Social History Main Topics  . Smoking status: Former Smoker    Types: Cigarettes  . Smokeless tobacco: Never Used     Comment: quit since pregnancy  . Alcohol use No  . Drug use: Yes    Frequency: 2.0 times per week    Types: Marijuana, Other-see comments     Comment: last time used before pregnancy  . Sexual activity: Yes    Partners: Male   Other Topics Concern  . Not on  file   Social History Narrative  . No narrative on file    Home Medications:  Medications reconciled in EPIC  No current facility-administered medications on file prior to encounter.    Current Outpatient Prescriptions on File Prior to Encounter  Medication Sig Dispense Refill  . ascorbic acid (VITAMIN C) 250 MG tablet Take 1 tablet (250 mg total) by mouth 2 (two) times daily with a meal. Take with iron for anemia 60 tablet 3  . docusate sodium (COLACE) 100 MG capsule Take 1 capsule (100 mg total) by mouth daily as needed for mild constipation. 60 capsule 3  . ferrous sulfate 325 (65 FE) MG tablet Take 1 tablet (325 mg total) by mouth 2 (two) times daily with a meal. For anemia, take with Vitamin C 60 tablet 3  . ibuprofen (ADVIL,MOTRIN) 800 MG tablet Take 1 tablet (800 mg total) by mouth every 8 (eight) hours as needed for moderate pain or cramping. 30 tablet 0  . oxyCODONE-acetaminophen (PERCOCET) 5-325 MG tablet Take 1-2 tablets by mouth every 6 (six) hours as needed for severe pain. 15 tablet 0  . Prenatal Vit-Fe Fumarate-FA (MULTIVITAMIN-PRENATAL) 27-0.8 MG TABS tablet Take 1 tablet by mouth daily at 12 noon.      Allergies:  No Known Allergies  Physical Exam:  Temp:  [97.7 F (36.5 C)-98.4 F (36.9 C)] 98.4 F (36.9 C) (02/03 2055) Pulse Rate:  [91-103] 91 (02/03 2232) Resp:  [18-22] 18 (02/03 2232) BP: (141-144)/(81-88) 141/88 (02/03 2204) SpO2:  [95 %-99 %] 96 % (02/03 2232)   General Appearance:  Well developed, well nourished, no acute distress, alert and oriented x3 HEENT:  Normocephalic atraumatic, extraocular movements intact, moist mucous membranes Cardiovascular:  Normal S1/S2, regular rate and rhythm, no murmurs Pulmonary:  clear to auscultation, no wheezes, rales or rhonchi, symmetric air entry, good air exchange, bilateral decreased breath sounds in lower lobes but no crackles throughout upper lobes.  Abdomen:  Bowel sounds present, soft, nontender,  nondistended, no abnormal masses, no epigastric pain- appropriate incisional pain at her C-section. Fundus is firm and nontender. Extremities:  Full range of motion, no pedal edema, 2+ distal pulses, no tenderness, no asymmetric swelling or erythema, negative Homans sign bilaterally. Skin:  normal coloration and turgor, no rashes, no suspicious skin lesions noted  Neurologic:  Cranial nerves 2-12 grossly intact, normal muscle tone, strength 5/5 all four extremities Psychiatric:  Normal mood and affect, appropriate, no AH/VH Pelvic:  deferred   Labs/Studies:   CBC and Coags:  Lab Results  Component Value Date   WBC 8.9 01/30/2017   NEUTOPHILPCT 67 01/30/2017   EOSPCT 3 01/30/2017   BASOPCT 1 01/30/2017   LYMPHOPCT 24 01/30/2017   HGB 9.5 (L) 01/30/2017   HCT 26.2 (L) 01/30/2017   MCV 85.9 01/30/2017   PLT 315 01/30/2017   CMP:  Lab Results  Component Value Date   NA 136 01/30/2017   K 3.5 01/30/2017   CL 105 01/30/2017   CO2 23 01/30/2017  BUN 12 01/30/2017   CREATININE 0.56 01/30/2017   CREATININE 0.48 01/04/2017   CREATININE 0.41 (L) 12/07/2016   PROT 6.8 01/04/2017   BILITOT 0.3 01/04/2017   ALT 20 01/04/2017   AST 33 01/04/2017   ALKPHOS 105 01/04/2017    Other Imaging: Dg Chest 2 View  Result Date: 01/30/2017 CLINICAL DATA:  Dyspnea and upper back pain EXAM: CHEST  2 VIEW COMPARISON:  None. FINDINGS: Diffuse interstitial fluid or thickening. No consolidation. No significant effusion. Normal hilar and mediastinal contours. Upper normal heart size. IMPRESSION: Diffuse interstitial thickening, likely fluid. No alveolar edema or significant pleural effusion. Electronically Signed   By: Ellery Plunk M.D.   On: 01/30/2017 21:47   Ct Angio Chest Pe W And/or Wo Contrast  Result Date: 01/30/2017 CLINICAL DATA:  Awakened with dyspnea and lower extremity swelling. Recent Cesarean section. EXAM: CT ANGIOGRAPHY CHEST WITH CONTRAST TECHNIQUE: Multidetector CT imaging of the  chest was performed using the standard protocol during bolus administration of intravenous contrast. Multiplanar CT image reconstructions and MIPs were obtained to evaluate the vascular anatomy. CONTRAST:  75 mL Isovue 370 intravenous COMPARISON:  None. FINDINGS: Cardiovascular: Satisfactory opacification of the pulmonary arteries to the segmental level. No evidence of pulmonary embolism. Normal heart size. No pericardial effusion. Mediastinum/Nodes: No enlarged mediastinal, hilar, or axillary lymph nodes. Thyroid gland, trachea, and esophagus demonstrate no significant findings. Lungs/Pleura: Diffuse interstitial fluid. Mild central alveolar fluid. No focal consolidation. Airways are patent and normal in caliber. Small pleural effusions bilaterally. Upper Abdomen: No acute abnormality. Musculoskeletal: No chest wall abnormality. No acute or significant osseous findings. Review of the MIP images confirms the above findings. IMPRESSION: Negative for pulmonary embolism. There is interstitial and alveolar fluid as well as pleural effusions, with appearances most consistent with fluid overload or CHF. Electronically Signed   By: Ellery Plunk M.D.   On: 01/30/2017 22:54     Assessment / Plan:   Julie [name] is a 21 y.o. G1P1001 who presents POD#2 with acutely worsening shortness of breath and edema. Differential diagnosis includes peripartum cardiomyopathy, fluid overload. Unlikely pulmonary embolism after a negative CT angiogram.   1. Congestive heart failure versus fluid overload: Patient is satting appropriately on room air. Her pain is minimal. She is afebrile, and appeared mildly uncomfortable but no significant increased work of breathing. I spoke with [facility] who is willing to transfer her if needed. However, at this time, it appears that she is stable as long as we do some aggressive diuresis. I have spoken with the hospitalist medicine team, and they have agreed to a medical admission for  diuresis and monitoring. We will continue to follow. She is doing well postoperatively. I would anticipate needing to transfer her if she begins requiring oxygen, or if we have concerns about her echocardiogram in the morning.   Thank you for the opportunity to be involved with this pt's care.

## 2017-01-31 NOTE — ED Notes (Signed)
MD at the bedside to discuss pt plan of care. Family at the bedside. Pt verbalized understanding.

## 2017-01-31 NOTE — ED Notes (Signed)
Pt up to restroom to void with assistance.

## 2017-01-31 NOTE — Progress Notes (Signed)
Lab called toponin result of 0.04. Called Dr  Luberta Mutter, left message for call back; texted results as well. Loel Ro, RN 01/31/17 1016

## 2017-01-31 NOTE — Progress Notes (Signed)
Obstetric and Gynecology  Subjective  Julie [name] is a 21 y.o. female G1P1001 who presented on 01/30/2017 for acute SOB and found to have pulm edema. She endorses new onset HA this morning and has just received ibuprofen. No hx of PreE. No RUQ pain, visual changes. Still with swollen ankles. She feels tearful when considering her baby. She is less anxious today.  Objective   Vitals:   01/31/17 0504 01/31/17 0758  BP: (!) 151/82 (!) 143/93  Pulse: 91 67  Resp: 20   Temp: 98.2 F (36.8 C) 98.2 F (36.8 C)     Intake/Output Summary (Last 24 hours) at 01/31/17 0950 Last data filed at 01/31/17 0730  Gross per 24 hour  Intake                0 ml  Output             1100 ml  Net            -1100 ml    General: NAD Cardiovascular: RRR, no murmurs Pulmonary: CTAB Abdomen: Benign. Non-tender, +BS, no guarding. Incision c/d/i. Extremities: No erythema or cords, no calf tenderness, +warmth with normal peripheral pulses. Neuro: Reflexes 1+, no hyperreflexia  Labs: Results for orders placed or performed during the hospital encounter of 01/30/17 (from the past 24 hour(s))  CBC with Differential     Status: Abnormal   Collection Time: 01/30/17  9:08 PM  Result Value Ref Range   WBC 8.9 3.6 - 11.0 K/uL   RBC 3.05 (L) 3.80 - 5.20 MIL/uL   Hemoglobin 9.5 (L) 12.0 - 16.0 g/dL   HCT 56.1 (L) 53.7 - 94.3 %   MCV 85.9 80.0 - 100.0 fL   MCH 31.1 26.0 - 34.0 pg   MCHC 36.2 (H) 32.0 - 36.0 g/dL   RDW 27.6 14.7 - 09.2 %   Platelets 315 150 - 440 K/uL   Neutrophils Relative % 67 %   Neutro Abs 6.0 1.4 - 6.5 K/uL   Lymphocytes Relative 24 %   Lymphs Abs 2.1 1.0 - 3.6 K/uL   Monocytes Relative 5 %   Monocytes Absolute 0.5 0.2 - 0.9 K/uL   Eosinophils Relative 3 %   Eosinophils Absolute 0.3 0 - 0.7 K/uL   Basophils Relative 1 %   Basophils Absolute 0.1 0 - 0.1 K/uL  Basic metabolic panel     Status: Abnormal   Collection Time: 01/30/17  9:08 PM  Result Value Ref Range   Sodium 136 135 -  145 mmol/L   Potassium 3.5 3.5 - 5.1 mmol/L   Chloride 105 101 - 111 mmol/L   CO2 23 22 - 32 mmol/L   Glucose, Bld 98 65 - 99 mg/dL   BUN 12 6 - 20 mg/dL   Creatinine, Ser 9.57 0.44 - 1.00 mg/dL   Calcium 8.2 (L) 8.9 - 10.3 mg/dL   GFR calc non Af Amer >60 >60 mL/min   GFR calc Af Amer >60 >60 mL/min   Anion gap 8 5 - 15  Brain natriuretic peptide     Status: Abnormal   Collection Time: 01/30/17  9:08 PM  Result Value Ref Range   B Natriuretic Peptide 190.0 (H) 0.0 - 100.0 pg/mL  Troponin I     Status: None   Collection Time: 01/30/17  9:08 PM  Result Value Ref Range   Troponin I <0.03 <0.03 ng/mL  Basic metabolic panel     Status: Abnormal   Collection Time: 01/31/17  2:32 AM  Result Value Ref Range   Sodium 137 135 - 145 mmol/L   Potassium 3.5 3.5 - 5.1 mmol/L   Chloride 103 101 - 111 mmol/L   CO2 25 22 - 32 mmol/L   Glucose, Bld 87 65 - 99 mg/dL   BUN 10 6 - 20 mg/dL   Creatinine, Ser 1.61 0.44 - 1.00 mg/dL   Calcium 8.1 (L) 8.9 - 10.3 mg/dL   GFR calc non Af Amer >60 >60 mL/min   GFR calc Af Amer >60 >60 mL/min   Anion gap 9 5 - 15  Troponin I (q 6hr x 3)     Status: None   Collection Time: 01/31/17  2:32 AM  Result Value Ref Range   Troponin I <0.03 <0.03 ng/mL  TSH     Status: Abnormal   Collection Time: 01/31/17  2:32 AM  Result Value Ref Range   TSH 5.180 (H) 0.350 - 4.500 uIU/mL  Lipid panel     Status: Abnormal   Collection Time: 01/31/17  2:32 AM  Result Value Ref Range   Cholesterol 148 0 - 169 mg/dL   Triglycerides 096 (H) <150 mg/dL   HDL 45 >04 mg/dL   Total CHOL/HDL Ratio 3.3 RATIO   VLDL 38 0 - 40 mg/dL   LDL Cholesterol 65 0 - 99 mg/dL  CBC     Status: Abnormal   Collection Time: 01/31/17  2:32 AM  Result Value Ref Range   WBC 9.2 3.6 - 11.0 K/uL   RBC 3.32 (L) 3.80 - 5.20 MIL/uL   Hemoglobin 10.1 (L) 12.0 - 16.0 g/dL   HCT 54.0 (L) 98.1 - 19.1 %   MCV 86.8 80.0 - 100.0 fL   MCH 30.6 26.0 - 34.0 pg   MCHC 35.2 32.0 - 36.0 g/dL   RDW 47.8  29.5 - 62.1 %   Platelets 336 150 - 440 K/uL    Cultures: Results for orders placed or performed during the hospital encounter of 01/25/17  OB RESULT CONSOLE Group B Strep     Status: None   Collection Time: 01/18/17 12:00 AM  Result Value Ref Range Status   GBS Positive  Final    Imaging: Dg Chest 2 View  Result Date: 01/30/2017 CLINICAL DATA:  Dyspnea and upper back pain EXAM: CHEST  2 VIEW COMPARISON:  None. FINDINGS: Diffuse interstitial fluid or thickening. No consolidation. No significant effusion. Normal hilar and mediastinal contours. Upper normal heart size. IMPRESSION: Diffuse interstitial thickening, likely fluid. No alveolar edema or significant pleural effusion. Electronically Signed   By: Ellery Plunk M.D.   On: 01/30/2017 21:47   Ct Angio Chest Pe W And/or Wo Contrast  Result Date: 01/30/2017 CLINICAL DATA:  Awakened with dyspnea and lower extremity swelling. Recent Cesarean section. EXAM: CT ANGIOGRAPHY CHEST WITH CONTRAST TECHNIQUE: Multidetector CT imaging of the chest was performed using the standard protocol during bolus administration of intravenous contrast. Multiplanar CT image reconstructions and MIPs were obtained to evaluate the vascular anatomy. CONTRAST:  75 mL Isovue 370 intravenous COMPARISON:  None. FINDINGS: Cardiovascular: Satisfactory opacification of the pulmonary arteries to the segmental level. No evidence of pulmonary embolism. Normal heart size. No pericardial effusion. Mediastinum/Nodes: No enlarged mediastinal, hilar, or axillary lymph nodes. Thyroid gland, trachea, and esophagus demonstrate no significant findings. Lungs/Pleura: Diffuse interstitial fluid. Mild central alveolar fluid. No focal consolidation. Airways are patent and normal in caliber. Small pleural effusions bilaterally. Upper Abdomen: No acute abnormality. Musculoskeletal: No chest wall abnormality. No  acute or significant osseous findings. Review of the MIP images confirms the above  findings. IMPRESSION: Negative for pulmonary embolism. There is interstitial and alveolar fluid as well as pleural effusions, with appearances most consistent with fluid overload or CHF. Electronically Signed   By: Ellery Plunk M.D.   On: 01/30/2017 22:54     Assessment   20 y.o. G1P1001 Hospital Day: 2   Plan   1. Postpartum fluid overload: Echocardiogram is pending, cardiology consult is pending. Agree with continued diuresis and treating symptoms. Her third troponin is still pending, the first 2 troponins were negative. Her BNP was mildly elevated. Overnight telemetry looks stable. 2. Elevated blood pressure with headache: Will add preeclampsia labs to be drawn labs this morning. Her reflexes are normal, and she just received medication for her headache. She thinks it is a bilateral tension headache due to crying overnight when she missed her baby. 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 We'll monitor her for preeclampsia, and start magnesium if preeclampsia becomes more probable. 3. Elevated TSH: Consider starting thyroid supplementation. She was likely not screened for subclinical hypothyroidism in this pregnancy without other symptoms. 4. Acute blood loss anemia: Continue iron, vitamin C 5. Lactation: Patient is not breast-feeding. No medication alterations need to be made for pregnancy or breast-feeding.  We will continue to follow along.

## 2017-01-31 NOTE — Progress Notes (Signed)
21 year old female patient admitted because of shortness of breath, pulmonary edema. Admitted for CHF and possible post  partum cardiomyopathy. Echocardiogram pending. Received IV Lasix yesterday. Today she feels better but still has swelling in the legs. Cardiology consult pending. No chest pain but troponin slightly elevated 0.04.  Labs reviewed, vitals reviewed, discussed with Dr. Toma Copier  from OB/GYN also.  #1 postpartum fluid overload: Echocardiogram pending, cardiology consult pending, continue IV diuretics today. Patient third  troponin is slightly elevated.  #2 elevated blood pressure with headache: Had preeclampsia labs, patient is really stressed about missing her baby, received Tylenol, continue IV Lasix to help with blood pressure, if there is any persistent elevation of blood pressure add IV magnesium also.  #3 elevated TSH; add thyroid supplements Discussed with patient, patient's mother at bedside. Time spent;35 min

## 2017-01-31 NOTE — Consult Note (Signed)
Reason for Consult:sob 3 days after delivery  Referring Physician: Dr. Ricki Rodriguez Julie [name] is an 21 y.o. female.   HPI: The patient is an 21 yo woman admitted for evaluation and treatment of sob, 3 days after delivery. She denies any major problems during her pregnancy except for gestational DM. She went home after an unremarkable Labor and delivery and  Came back sob with swollen legs. No chest pain. No evidence for SVT. She had not had syncope. She has been treated with IV lasix and her dyspnea is much improved. Her leg swelling is also better. A 2D echo is pending.   PMH: Past Medical History:  Diagnosis Date  . Gestational diabetes 10/06/2016  . Headache(784.0) 10/06/2004  . History of C-section 01/28/2017    PSHX: Past Surgical History:  Procedure Laterality Date  . CESAREAN SECTION N/A 01/28/2017   Procedure: CESAREAN SECTION;  Surgeon: Suzy Bouchard, MD;  Location: [facility] ORS;  Service: Obstetrics;  Laterality: N/A;  . TONSILLECTOMY Bilateral 10/06/2016    FAMHX: Family History  Problem Relation Age of Onset  . Anxiety disorder Mother   . Depression Mother   . Diabetes Mother   . Migraines Maternal Grandmother   . Anxiety disorder Maternal Grandmother   . Depression Maternal Grandmother   . Migraines Maternal Aunt   . Migraines Maternal Uncle     Social History:  reports that she has quit smoking. Her smoking use included Cigarettes. She has never used smokeless tobacco. She reports that she uses drugs, including Marijuana and Other-see comments, about 2 times per week. She reports that she does not drink alcohol.  Allergies: No Known Allergies  Medications: I have reviewed the patient's current medications.  Dg Chest 2 View  Result Date: 01/30/2017 CLINICAL DATA:  Dyspnea and upper back pain EXAM: CHEST  2 VIEW COMPARISON:  None. FINDINGS: Diffuse interstitial fluid or thickening. No consolidation. No significant effusion. Normal hilar and mediastinal  contours. Upper normal heart size. IMPRESSION: Diffuse interstitial thickening, likely fluid. No alveolar edema or significant pleural effusion. Electronically Signed   By: Ellery Plunk M.D.   On: 01/30/2017 21:47   Ct Angio Chest Pe W And/or Wo Contrast  Result Date: 01/30/2017 CLINICAL DATA:  Awakened with dyspnea and lower extremity swelling. Recent Cesarean section. EXAM: CT ANGIOGRAPHY CHEST WITH CONTRAST TECHNIQUE: Multidetector CT imaging of the chest was performed using the standard protocol during bolus administration of intravenous contrast. Multiplanar CT image reconstructions and MIPs were obtained to evaluate the vascular anatomy. CONTRAST:  75 mL Isovue 370 intravenous COMPARISON:  None. FINDINGS: Cardiovascular: Satisfactory opacification of the pulmonary arteries to the segmental level. No evidence of pulmonary embolism. Normal heart size. No pericardial effusion. Mediastinum/Nodes: No enlarged mediastinal, hilar, or axillary lymph nodes. Thyroid gland, trachea, and esophagus demonstrate no significant findings. Lungs/Pleura: Diffuse interstitial fluid. Mild central alveolar fluid. No focal consolidation. Airways are patent and normal in caliber. Small pleural effusions bilaterally. Upper Abdomen: No acute abnormality. Musculoskeletal: No chest wall abnormality. No acute or significant osseous findings. Review of the MIP images confirms the above findings. IMPRESSION: Negative for pulmonary embolism. There is interstitial and alveolar fluid as well as pleural effusions, with appearances most consistent with fluid overload or CHF. Electronically Signed   By: Ellery Plunk M.D.   On: 01/30/2017 22:54    ROS  As stated in the HPI and negative for all other systems.  Physical Exam  Vitals:Blood pressure (!) 154/93, pulse 91, temperature 98.4 F (36.9 C),  temperature source Oral, resp. rate 12, height 5\' 5"  (1.651 m), weight 268 lb 6.4 oz (121.7 kg), SpO2 97 %, unknown if currently  breastfeeding.  obese appearing NAD HEENT: Unremarkable Neck: 7 cm JVD, no thyromegally Lymphatics:  No adenopathy Back:  No CVA tenderness Lungs:  Clear with no wheezes HEART:  Regular rate rhythm, no murmurs, no rubs, no clicks, soft S4 is present. I do not appreciate an S3. Abd: obese, positive bowel sounds, no organomegally, no rebound, no guarding Ext:  2 plus pulses, 1+ edema, no cyanosis, no clubbing Skin:  No rashes no nodules Neuro:  CN II through XII intact, motor grossly intact  Tele -NSR  Ecg - NSR   Labs - reviewed. Troponin essentially normal   Assessment/Plan: 1. Sob with volume overload 3 days after delivery - she could have a peripartum CM although we do not have an echo. Await the echo. For now, would consider continuing lasix 40 mg daily and if her EF is down, would start an ACE or ARB and a low dose of coreg. If her EF is not down, give diuretic for a few days and then stop.  2. Obesity - she desperately needs to lose weight.   Asahd Can,M.D.  Sharlot Gowda TaylorMD 01/31/2017, 12:49 PM

## 2017-01-31 NOTE — H&P (Signed)
History and Physical   [facility] [place] @ [facility] Admission History and Physical [employer], D.O.    Patient Name: Julie [name] MR#: [id number] Date of Birth: 07-08-1996 Date of Admission: 01/30/2017  Referring MD/NP/PA: Dr. Dalbert Garnet Primary Care Physician: [facility] Specialists: Dr. Dalbert Garnet  Patient coming from: Home  Chief Complaint: SOB  HPI: Julie [name] is a 21 y.o. female with a known history of gestational diabetes who is 2 days postpartum status post C-section after failed induction. This was her first pregnancy. She delivered a healthy baby boy at [redacted] weeks gestation. Patient was discharged home from the hospital one day ago and reports the sudden onset of progressively worsening shortness of breath associated with bilateral hand and foot swelling. She reports that she did have some appropriate swelling during her pregnancy which became significantly worse yesterday. She states that she has a scant dry cough but denies fevers, chills, chest pain. She states that she has normal urine and stool output.  Of note she complains of mild mid back pain as well as pain at the C-section incision site.  Patient is not breast-feeding  Patient denies fevers/chills, weakness, dizziness, chest pain, N/V/C/D, abdominal pain, dysuria/frequency, changes in mental status.   ED Course: Patient received 40 mg IV Lasix  Review of Systems:  CONSTITUTIONAL: No fever/chills, fatigue, weakness, weight gain/loss, headache. EYES: No blurry or double vision. ENT: No tinnitus, postnasal drip, redness or soreness of the oropharynx. RESPIRATORY: Positive cough, dyspnea, negative wheeze.  No hemoptysis.  CARDIOVASCULAR: No chest pain, palpitations, syncope, orthopnea. Positive facial and bilateral upper and lower extremity edema.  GASTROINTESTINAL: No nausea, vomiting, abdominal pain, diarrhea, constipation.  No hematemesis, melena or hematochezia. GENITOURINARY: No  dysuria, frequency, hematuria. ENDOCRINE: No polyuria or nocturia. No heat or cold intolerance. HEMATOLOGY: No anemia, bruising, bleeding. INTEGUMENTARY: No rashes, ulcers, lesions. MUSCULOSKELETAL: No arthritis, gout, dyspnea. NEUROLOGIC: No numbness, tingling, ataxia, seizure-type activity, weakness. PSYCHIATRIC: No anxiety, depression, insomnia.   Past Medical History:  Diagnosis Date  . Gestational diabetes   . Headache(784.0)   Mom reports a questionable history of sleep apnea which was not diagnosed and did not improve following tonsillectomy and adenoidectomy  Past Surgical History:  Procedure Laterality Date  . CESAREAN SECTION N/A 01/28/2017   Procedure: CESAREAN SECTION;  Surgeon: Suzy Bouchard, MD;  Location: [facility] ORS;  Service: Obstetrics;  Laterality: N/A;  . TONSILLECTOMY       reports that she has quit smoking. Her smoking use included Cigarettes. She has never used smokeless tobacco. She reports that she uses drugs, including Marijuana and Other-see comments, about 2 times per week. She reports that she does not drink alcohol. Patient is not working and is not in school.  No Known Allergies  Family History  Problem Relation Age of Onset  . Anxiety disorder Mother   . Depression Mother   . Diabetes Mother   . Migraines Maternal Grandmother   . Anxiety disorder Maternal Grandmother   . Depression Maternal Grandmother   . Migraines Maternal Aunt   . Migraines Maternal Uncle    Family history has been reviewed and confirmed with patient.   Prior to Admission medications   Medication Sig Start Date End Date Taking? Authorizing Provider  ascorbic acid (VITAMIN C) 250 MG tablet Take 1 tablet (250 mg total) by mouth 2 (two) times daily with a meal. Take with iron for anemia 01/30/17 03/31/17 Yes Christeen Douglas, MD  docusate sodium (COLACE) 100 MG capsule Take 1  capsule (100 mg total) by mouth daily as needed for mild constipation. 01/30/17  Yes Christeen Douglas, MD   ferrous sulfate 325 (65 FE) MG tablet Take 1 tablet (325 mg total) by mouth 2 (two) times daily with a meal. For anemia, take with Vitamin C 01/30/17 03/31/17 Yes Christeen Douglas, MD  ibuprofen (ADVIL,MOTRIN) 800 MG tablet Take 1 tablet (800 mg total) by mouth every 8 (eight) hours as needed for moderate pain or cramping. 01/30/17 02/06/17 Yes Christeen Douglas, MD  oxyCODONE-acetaminophen (PERCOCET) 5-325 MG tablet Take 1-2 tablets by mouth every 6 (six) hours as needed for severe pain. 01/30/17  Yes Christeen Douglas, MD  Prenatal Vit-Fe Fumarate-FA (MULTIVITAMIN-PRENATAL) 27-0.8 MG TABS tablet Take 1 tablet by mouth daily at 12 noon.   Yes Historical Provider, MD    Physical Exam: Vitals:   01/30/17 2055 01/30/17 2204 01/30/17 2232  BP: (!) 142/88 (!) 141/88   Pulse: 91  91  Resp: (!) 22 19 18   Temp: 98.4 F (36.9 C)    TempSrc: Oral    SpO2: 95%  96%    GENERAL: 21 y.o.-year-old Obese female patient, well-developed, well-nourished lying in the bed in no acute distress.  Pleasant and cooperative.   HEENT: Head atraumatic, normocephalic. Pupils equal, round, reactive to light and accommodation. No scleral icterus. Extraocular muscles intact. Nares are patent. Oropharynx is clear. Mucus membranes moist. NECK: Supple, full range of motion. No JVD, no bruit heard. No thyroid enlargement, no tenderness, no cervical lymphadenopathy. CHEST: Mild bibasilar rales. No use of accessory muscles of respiration.  No reproducible chest wall tenderness.  CARDIOVASCULAR: S1, S2 normal. No murmurs, rubs, or gallops. Cap refill <2 seconds. Pulses intact distally.  ABDOMEN: Soft, nondistended, nontender. No rebound, guarding, rigidity. Normoactive bowel sounds present in all four quadrants. No organomegaly or mass. EXTREMITIES: Puffy nonpitting edema bilateral hands and feet. No cyanosis, or clubbing. No calf tenderness or Homan's sign.  NEUROLOGIC: The patient is alert and oriented x 3. Cranial nerves II through XII  are grossly intact with no focal sensorimotor deficit. Muscle strength 5/5 in all extremities. Sensation intact. Gait not checked. PSYCHIATRIC:  Normal affect, mood, thought content. SKIN: Warm, dry, and intact without obvious rash, lesion, or ulcer.    Labs on Admission:  CBC:  Recent Labs Lab 01/25/17 2027 01/29/17 0518 01/30/17 2108  WBC 9.6 10.2 8.9  NEUTROABS  --   --  6.0  HGB 11.9* 10.0* 9.5*  HCT 33.6* 27.3* 26.2*  MCV 86.5 86.6 85.9  PLT 337 244 315   Basic Metabolic Panel:  Recent Labs Lab 01/30/17 2108  NA 136  K 3.5  CL 105  CO2 23  GLUCOSE 98  BUN 12  CREATININE 0.56  CALCIUM 8.2*   GFR: Estimated Creatinine Clearance: 146.7 mL/min (by C-G formula based on SCr of 0.56 mg/dL). Liver Function Tests: No results for input(s): AST, ALT, ALKPHOS, BILITOT, PROT, ALBUMIN in the last 168 hours. No results for input(s): LIPASE, AMYLASE in the last 168 hours. No results for input(s): AMMONIA in the last 168 hours. Coagulation Profile: No results for input(s): INR, PROTIME in the last 168 hours. Cardiac Enzymes:  Recent Labs Lab 01/30/17 2108  TROPONINI <0.03   BNP (last 3 results) No results for input(s): PROBNP in the last 8760 hours. HbA1C: No results for input(s): HGBA1C in the last 72 hours. CBG:  Recent Labs Lab 01/28/17 1212 01/28/17 1725 01/29/17 0843 01/29/17 1630 01/30/17 0839  GLUCAP 103* 88 71 97 76  Lipid Profile: No results for input(s): CHOL, HDL, LDLCALC, TRIG, CHOLHDL, LDLDIRECT in the last 72 hours. Thyroid Function Tests: No results for input(s): TSH, T4TOTAL, FREET4, T3FREE, THYROIDAB in the last 72 hours. Anemia Panel: No results for input(s): VITAMINB12, FOLATE, FERRITIN, TIBC, IRON, RETICCTPCT in the last 72 hours. Urine analysis:  Sepsis Labs: @LABRCNTIP (procalcitonin:4,lacticidven:4) )No results found for this or any previous visit (from the past 240 hour(s)).   Radiological Exams on Admission: Dg Chest 2  View  Result Date: 01/30/2017 CLINICAL DATA:  Dyspnea and upper back pain EXAM: CHEST  2 VIEW COMPARISON:  None. FINDINGS: Diffuse interstitial fluid or thickening. No consolidation. No significant effusion. Normal hilar and mediastinal contours. Upper normal heart size. IMPRESSION: Diffuse interstitial thickening, likely fluid. No alveolar edema or significant pleural effusion. Electronically Signed   By: Ellery Plunk M.D.   On: 01/30/2017 21:47   Ct Angio Chest Pe W And/or Wo Contrast  Result Date: 01/30/2017 CLINICAL DATA:  Awakened with dyspnea and lower extremity swelling. Recent Cesarean section. EXAM: CT ANGIOGRAPHY CHEST WITH CONTRAST TECHNIQUE: Multidetector CT imaging of the chest was performed using the standard protocol during bolus administration of intravenous contrast. Multiplanar CT image reconstructions and MIPs were obtained to evaluate the vascular anatomy. CONTRAST:  75 mL Isovue 370 intravenous COMPARISON:  None. FINDINGS: Cardiovascular: Satisfactory opacification of the pulmonary arteries to the segmental level. No evidence of pulmonary embolism. Normal heart size. No pericardial effusion. Mediastinum/Nodes: No enlarged mediastinal, hilar, or axillary lymph nodes. Thyroid gland, trachea, and esophagus demonstrate no significant findings. Lungs/Pleura: Diffuse interstitial fluid. Mild central alveolar fluid. No focal consolidation. Airways are patent and normal in caliber. Small pleural effusions bilaterally. Upper Abdomen: No acute abnormality. Musculoskeletal: No chest wall abnormality. No acute or significant osseous findings. Review of the MIP images confirms the above findings. IMPRESSION: Negative for pulmonary embolism. There is interstitial and alveolar fluid as well as pleural effusions, with appearances most consistent with fluid overload or CHF. Electronically Signed   By: Ellery Plunk M.D.   On: 01/30/2017 22:54    EKG: NSR at 96bpm, normal axis, no ischemic  changes.   Assessment/Plan Active Problems:   Pleural effusion in other conditions classified elsewhere   Pulmonary edema cardiac cause [facility])    This is a 21 y.o. female with a history of gestational diabetes, migraine headaches now being admitted with:  1. 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  Patient is currently stable with normal vital signs, labs and EKG. Symptomatically she is improving after 1 dose of 40 mg of IV Lasix. We will therefore admit her for: - Telemetry monitoring. - Diuresis - Intake/output, daily weight. - Trend troponins, check lipids and TSH. - Check echo - Cardiology consultation requested. -Per Dr. Eulah Pont maternal-fetal medicine has agreed to accept the patient in transfer if she decompensates. But for now we will monitor and work her up here.  She and her family are in agreement with this plan.  2. Anemia status post C-section -Monitor CBC  Admission status: Inpatient, telemetry IV Fluids: HL with fluid restriction Diet/Nutrition: Heart healthy Consults called: Cardiology  DVT Px: Lovenox, SCDs and early ambulation. Code Status: Full Code  Disposition Plan: To home in 1-2 days   All the records are reviewed and case discussed with Dr. Dalbert Garnet. Management plans discussed with the patient and/or family who express understanding and agree with plan of care.  Ziah Leandro D.O. on 01/31/2017 at 1:37 AM Between 7am to  6pm - Pager - [phone] After 6pm go to [email or web address] - Social research officer, government [facility] [place] [facility] Office [phone] CC: Primary care physician; [facility]   01/31/2017, 1:37 AM

## 2017-02-01 ENCOUNTER — Telehealth: Payer: Self-pay | Admitting: Cardiovascular Disease

## 2017-02-01 DIAGNOSIS — I5031 Acute diastolic (congestive) heart failure: Secondary | ICD-10-CM

## 2017-02-01 LAB — BASIC METABOLIC PANEL
Anion gap: 7 (ref 5–15)
BUN: 16 mg/dL (ref 6–20)
CHLORIDE: 106 mmol/L (ref 101–111)
CO2: 26 mmol/L (ref 22–32)
CREATININE: 0.56 mg/dL (ref 0.44–1.00)
Calcium: 8.2 mg/dL — ABNORMAL LOW (ref 8.9–10.3)
GFR calc Af Amer: >60 mL/min (ref >60)
GLUCOSE: 85 mg/dL (ref 65–99)
POTASSIUM: 3.3 mmol/L — AB (ref 3.5–5.1)
Sodium: 139 mmol/L (ref 135–145)

## 2017-02-01 MED ORDER — FUROSEMIDE 20 MG PO TABS: 20.0000 mg | ORAL_TABLET | Freq: Every day | ORAL | 0 refills | Status: DC

## 2017-02-01 MED ORDER — POTASSIUM CHLORIDE ER 10 MEQ PO TBCR: 10.0000 meq | EXTENDED_RELEASE_TABLET | Freq: Every day | ORAL | 0 refills | Status: DC

## 2017-02-01 MED ORDER — FUROSEMIDE 40 MG PO TABS: 40.0000 mg | ORAL_TABLET | Freq: Every day | ORAL | 0 refills | Status: DC

## 2017-02-01 MED ORDER — LEVOTHYROXINE SODIUM 25 MCG PO TABS: 25.0000 ug | ORAL_TABLET | Freq: Every day | ORAL | 0 refills | Status: AC

## 2017-02-01 NOTE — Discharge Summary (Signed)
Julie [name], is a 21 y.o. female  DOB 1996/03/22  MRN [id number].  Admission date:  01/30/2017  Admitting Physician  Christeen Douglas, MD  Discharge Date:  02/01/2017   Primary MD  [facility]  Recommendations for primary care physician for things to follow:   Follow-up with [facility] cardiology in one month for repeat echocardiogram.   Admission Diagnosis  Acute pulmonary edema (HCC) [J81.0] SOB (shortness of breath) [R06.02]   Discharge Diagnosis  Acute pulmonary edema (HCC) [J81.0] SOB (shortness of breath) [R06.02]    Active Problems:   Pleural effusion in other conditions classified elsewhere   Pulmonary edema cardiac cause [facility])      Past Medical History:  Diagnosis Date  . Gestational diabetes 10/06/2016  . Headache(784.0) 10/06/2004  . History of C-section 01/28/2017    Past Surgical History:  Procedure Laterality Date  . CESAREAN SECTION N/A 01/28/2017   Procedure: CESAREAN SECTION;  Surgeon: Suzy Bouchard, MD;  Location: [facility] ORS;  Service: Obstetrics;  Laterality: N/A;  . TONSILLECTOMY Bilateral 10/06/2016       History of present illness and  Hospital Course:     Kindly see H&P for history of present illness and admission details, please review complete Labs, Consult reports and Test reports for all details in brief  HPI  from the history and physical done on the day of admission  Julie [name] is a 21 y.o. female with a known history of gestational diabetes who is 2 days postpartum status post C-section after failed induction. This was her first pregnancy. She delivered a healthy baby boy at [redacted] weeks gestation. Patient was discharged home from the hospital one day ago and reports the sudden onset of progressively worsening shortness of breath associated with bilateral hand and foot  swelling. She reports that she did have some appropriate swelling during her pregnancy which became significantly worse yesterday. She states that she has a scant dry cough but denies fevers, chills, chest pain. She states that she has normal urine and stool output.   Hospital Course  #1/new onset heart failure: Patient received IV Lasix in the emergency room, she had normal EKG and normal vitals, admitted to hospitalist service for the same. Seen by GYN, cardiology. Patient had pedal edema which improved with IV Lasix. Echocardiogram showed EF more than 60%. Patient feels better today. Leg edema, shortness of breath improved. Patient had acute diastolic heart failure without evidence of peripartum cardiomyopathy. Had moderate mitral regurgitation, cardiology recommended Lasix 20 mg for 5 days only, patient didn't follow up with cardiology in one month for a repeat echocardiogram to ensure resolution of an off and pulmonary hypertension.  . #2 postop course was cesarean section uncomplicated. Activity encouraged.  #3 new diagnosis of hypothyroidism: Started on low-dose of Synthyroid. Advised to follow-up with primary doctor in 3 months for checkup or TSH. And adjust the dose of thyroid medicine. Discussed  this with patient and patient's mother at bedside.     Discharge Condition: Stable   Follow UP  Follow-up Information    [facility] Follow up in 1 week(s).   Specialty:  General Practice Contact information: [address] [place] [address] [phone]        [name], MD Follow up in 1 week(s).   Specialty:  Cardiology Contact information: [address] [place] [address] [phone]             Discharge Instructions  and  Discharge Medications      Allergies as of 02/01/2017   No Known Allergies     Medication List    TAKE these medications   ascorbic acid 250 MG tablet Commonly known as:  VITAMIN C Take  1 tablet (250 mg total) by mouth 2 (two) times daily with a meal. Take with iron for anemia Notes to patient:  Not Given Today 02/01/17   docusate sodium 100 MG capsule Commonly known as:  COLACE Take 1 capsule (100 mg total) by mouth daily as needed for mild constipation. Notes to patient:  Not Given Today 02/01/17   ferrous sulfate 325 (65 FE) MG tablet Take 1 tablet (325 mg total) by mouth 2 (two) times daily with a meal. For anemia, take with Vitamin C Notes to patient:  Not Given Today 02/01/17   furosemide 40 MG tablet Commonly known as:  LASIX Take 1 tablet (40 mg total) by mouth daily.   ibuprofen 800 MG tablet Commonly known as:  ADVIL,MOTRIN Take 1 tablet (800 mg total) by mouth every 8 (eight) hours as needed for moderate pain or cramping. Notes to patient:  Not Given Today 02/01/17   levothyroxine 25 MCG tablet Commonly known as:  SYNTHROID, LEVOTHROID Take 1 tablet (25 mcg total) by mouth daily before breakfast. Notes to patient:  Not Given Today 02/01/17   multivitamin-prenatal 27-0.8 MG Tabs tablet Take 1 tablet by mouth daily at 12 noon. Notes to patient:  Not Given Today 02/01/17   oxyCODONE-acetaminophen 5-325 MG tablet Commonly known as:  PERCOCET Take 1-2 tablets by mouth every 6 (six) hours as needed for severe pain.   potassium chloride 10 MEQ tablet Commonly known as:  K-DUR Take 1 tablet (10 mEq total) by mouth daily. Notes to patient:  Not Given Today 02/01/17         Diet and Activity recommendation: See Discharge Instructions above   Consults obtained - cardio, gyn   Major procedures and Radiology Reports - PLEASE review detailed and final reports for all details, in brief -      Dg Chest 2 View  Result Date: 01/30/2017 CLINICAL DATA:  Dyspnea and upper back pain EXAM: CHEST  2 VIEW COMPARISON:  None. FINDINGS: Diffuse interstitial fluid or thickening. No consolidation. No significant effusion. Normal hilar and mediastinal contours. Upper normal  heart size. IMPRESSION: Diffuse interstitial thickening, likely fluid. No alveolar edema or significant pleural effusion. Electronically Signed   By: Ellery Plunk M.D.   On: 01/30/2017 21:47   Ct Angio Chest Pe W And/or Wo Contrast  Result Date: 01/30/2017 CLINICAL DATA:  Awakened with dyspnea and lower extremity swelling. Recent Cesarean section. EXAM: CT ANGIOGRAPHY CHEST WITH CONTRAST TECHNIQUE: Multidetector CT imaging of the chest was performed using the standard protocol during bolus administration of intravenous contrast. Multiplanar CT image reconstructions and MIPs were obtained to evaluate the vascular anatomy. CONTRAST:  75 mL Isovue 370 intravenous COMPARISON:  None. FINDINGS: Cardiovascular: Satisfactory opacification of the pulmonary arteries to the segmental level. No evidence of pulmonary embolism. Normal heart size. No pericardial effusion. Mediastinum/Nodes: No enlarged mediastinal, hilar, or axillary lymph nodes. Thyroid gland, trachea, and esophagus demonstrate no significant findings. Lungs/Pleura: Diffuse interstitial fluid. Mild central alveolar fluid. No focal consolidation. Airways are patent and normal in caliber. Small pleural effusions bilaterally. Upper Abdomen: No acute abnormality. Musculoskeletal: No chest wall abnormality. No acute or significant osseous findings. Review of the MIP images confirms the above findings. IMPRESSION: Negative for pulmonary embolism. There is interstitial  and alveolar fluid as well as pleural effusions, with appearances most consistent with fluid overload or CHF. Electronically Signed   By: Ellery Plunk M.D.   On: 01/30/2017 22:54    Micro Results     No results found for this or any previous visit (from the past 240 hour(s)).     Today   Subjective:   Julie [name] today has no Shortness of breath, eager to go home.  Objective:   Blood pressure 136/89, pulse 75, temperature 98.2 F (36.8 C), temperature source Oral, resp.  rate 14, height 5\' 5"  (1.651 m), weight 121.7 kg (268 lb 6.4 oz), SpO2 98 %, unknown if currently breastfeeding.   Intake/Output Summary (Last 24 hours) at 02/01/17 1020 Last data filed at 02/01/17 0619  Gross per 24 hour  Intake              480 ml  Output             3900 ml  Net            -3420 ml    Exam Awake Alert, Oriented x 3, No new F.N deficits, Normal affect [place].AT,PERRAL Supple Neck,No JVD, No cervical lymphadenopathy appriciated.  Symmetrical Chest wall movement, Good air movement bilaterally, CTAB RRR,No Gallops,Rubs or new Murmurs, No Parasternal Heave +ve B.Sounds, Abd Soft, Non tender, No organomegaly appriciated, No rebound -guarding or rigidity. No Cyanosis, Clubbing or edema, No new Rash or bruise  Data Review   CBC w Diff: Lab Results  Component Value Date   WBC 9.2 01/31/2017   HGB 10.1 (L) 01/31/2017   HCT 28.8 (L) 01/31/2017   PLT 336 01/31/2017   LYMPHOPCT 24 01/30/2017   MONOPCT 5 01/30/2017   EOSPCT 3 01/30/2017   BASOPCT 1 01/30/2017    CMP: Lab Results  Component Value Date   NA 139 02/01/2017   K 3.3 (L) 02/01/2017   CL 106 02/01/2017   CO2 26 02/01/2017   BUN 16 02/01/2017   CREATININE 0.56 02/01/2017   PROT 6.9 01/31/2017   ALBUMIN 2.7 (L) 01/31/2017   BILITOT 0.6 01/31/2017   ALKPHOS 102 01/31/2017   AST 59 (H) 01/31/2017   ALT 24 01/31/2017  .   Total Time in preparing paper work, data evaluation and todays exam - 35 minutes  Ming Mcmannis M.D on 02/01/2017 at 10:20 AM    Note: This dictation was prepared with Dragon dictation along with smaller phrase technology. Any transcriptional errors that result from this process are unintentional.

## 2017-02-01 NOTE — Progress Notes (Signed)
Obstetric and Gynecology  Subjective  Julie [name] is a 21 y.o. female G1P1001 who presented on 01/30/2017 for SOB and found to have fluid overload. ECHo normal ejection fraction. Improved with lasix x2 days.  Her BP was elevated to 140-150/80-105. She has a hx of no protein in urine, but 24hr urine protein on 1/13 was 353, giving her mild preE dx. No sx of severe features and other preE labs were wnl, including BUN/Cr-.  On DOD, she has no headache, no RUQ pain, and her LE edema has resolved. Her SOB has resolved. Her incision is c/d/i and she is ready for discharge.   Objective   Vitals:   02/01/17 0844 02/01/17 0846  BP: (!) 150/105 136/89  Pulse: 82 75  Resp: 14   Temp: 98.2 F (36.8 C)      Intake/Output Summary (Last 24 hours) at 02/01/17 1005 Last data filed at 02/01/17 0315  Gross per 24 hour  Intake              720 ml  Output             3900 ml  Net            -3180 ml    General: NAD Cardiovascular: RRR, no murmurs Pulmonary: CTAB Abdomen: Benign. Non-tender, +BS, no guarding. Extremities: No erythema or cords, no calf tenderness, +warmth with normal peripheral pulses.  Labs: Results for orders placed or performed during the hospital encounter of 01/30/17 (from the past 24 hour(s))  Troponin I (q 6hr x 3)     Status: None   Collection Time: 01/31/17 12:52 PM  Result Value Ref Range   Troponin I <0.03 <0.03 ng/mL  Comprehensive metabolic panel     Status: Abnormal   Collection Time: 01/31/17 12:52 PM  Result Value Ref Range   Sodium 140 135 - 145 mmol/L   Potassium 3.7 3.5 - 5.1 mmol/L   Chloride 105 101 - 111 mmol/L   CO2 26 22 - 32 mmol/L   Glucose, Bld 112 (H) 65 - 99 mg/dL   BUN 11 6 - 20 mg/dL   Creatinine, Ser 9.45 0.44 - 1.00 mg/dL   Calcium 8.6 (L) 8.9 - 10.3 mg/dL   Total Protein 6.9 6.5 - 8.1 g/dL   Albumin 2.7 (L) 3.5 - 5.0 g/dL   AST 59 (H) 15 - 41 U/L   ALT 24 14 - 54 U/L   Alkaline Phosphatase 102 38 - 126 U/L   Total Bilirubin 0.6  0.3 - 1.2 mg/dL   GFR calc non Af Amer >60 >60 mL/min   GFR calc Af Amer >60 >60 mL/min   Anion gap 9 5 - 15  Uric acid     Status: None   Collection Time: 01/31/17 12:52 PM  Result Value Ref Range   Uric Acid, Serum 6.2 2.3 - 6.6 mg/dL  Basic metabolic panel     Status: Abnormal   Collection Time: 02/01/17  4:52 AM  Result Value Ref Range   Sodium 139 135 - 145 mmol/L   Potassium 3.3 (L) 3.5 - 5.1 mmol/L   Chloride 106 101 - 111 mmol/L   CO2 26 22 - 32 mmol/L   Glucose, Bld 85 65 - 99 mg/dL   BUN 16 6 - 20 mg/dL   Creatinine, Ser 8.59 0.44 - 1.00 mg/dL   Calcium 8.2 (L) 8.9 - 10.3 mg/dL   GFR calc non Af Amer >60 >60 mL/min   GFR calc Af  Amer >60 >60 mL/min   Anion gap 7 5 - 15    Cultures: Results for orders placed or performed during the hospital encounter of 01/25/17  OB RESULT CONSOLE Group B Strep     Status: None   Collection Time: 01/18/17 12:00 AM  Result Value Ref Range Status   GBS Positive  Final    Imaging: Dg Chest 2 View  Result Date: 01/30/2017 CLINICAL DATA:  Dyspnea and upper back pain EXAM: CHEST  2 VIEW COMPARISON:  None. FINDINGS: Diffuse interstitial fluid or thickening. No consolidation. No significant effusion. Normal hilar and mediastinal contours. Upper normal heart size. IMPRESSION: Diffuse interstitial thickening, likely fluid. No alveolar edema or significant pleural effusion. Electronically Signed   By: Ellery Plunk M.D.   On: 01/30/2017 21:47   Ct Angio Chest Pe W And/or Wo Contrast  Result Date: 01/30/2017 CLINICAL DATA:  Awakened with dyspnea and lower extremity swelling. Recent Cesarean section. EXAM: CT ANGIOGRAPHY CHEST WITH CONTRAST TECHNIQUE: Multidetector CT imaging of the chest was performed using the standard protocol during bolus administration of intravenous contrast. Multiplanar CT image reconstructions and MIPs were obtained to evaluate the vascular anatomy. CONTRAST:  75 mL Isovue 370 intravenous COMPARISON:  None. FINDINGS:  Cardiovascular: Satisfactory opacification of the pulmonary arteries to the segmental level. No evidence of pulmonary embolism. Normal heart size. No pericardial effusion. Mediastinum/Nodes: No enlarged mediastinal, hilar, or axillary lymph nodes. Thyroid gland, trachea, and esophagus demonstrate no significant findings. Lungs/Pleura: Diffuse interstitial fluid. Mild central alveolar fluid. No focal consolidation. Airways are patent and normal in caliber. Small pleural effusions bilaterally. Upper Abdomen: No acute abnormality. Musculoskeletal: No chest wall abnormality. No acute or significant osseous findings. Review of the MIP images confirms the above findings. IMPRESSION: Negative for pulmonary embolism. There is interstitial and alveolar fluid as well as pleural effusions, with appearances most consistent with fluid overload or CHF. Electronically Signed   By: Ellery Plunk M.D.   On: 01/30/2017 22:54   ECHO 01/31/17:  - Left ventricle: The cavity size was normal. Systolic function was   normal. The estimated ejection fraction was in the range of 60%   to 65%. Wall motion was normal; there were no regional wall   motion abnormalities. Left ventricular diastolic function   parameters were normal. - Mitral valve: There was moderate regurgitation. - Left atrium: The atrium was mildly dilated. - Right ventricle: Systolic function was normal. - Tricuspid valve: There was moderate regurgitation. - Pulmonary arteries: Systolic pressure was moderate to severely   elevated. PA peak pressure: 57 mm Hg (S).  Assessment   20 y.o. G1P1001 Hospital Day: 3   Plan   1. Fluid overload with normal Echo and no sx of severe PreE: agree with continue diuresis x5 days per medicine.   2. Mitral regurgitation with pulm HTN and high blood pressure: Agree with repeat Echo for 1 month. Will f/u with cardiology as outpatient per their note.  3. Postop course: Uncomplicated. Incision /c/d/i. Pain minimal. No  evidence of infection. Activity encouraged.  4. PreE precautions given and emphasized. Is now normotensive. No medication, but will repeat BP in 1 week at postop visit.

## 2017-02-01 NOTE — Telephone Encounter (Signed)
Patient contacted regarding discharge from [facility] on 02/01/17.  Patient understands to follow up with provider Dr Kirke Corin on 02/12/17 at 11:15 am in [place].    Patient understands discharge instructions? Yes Patient understands medications and regiment? Yes  Patient understands to bring all medications to this visit? Yes   No further questions at this time.  Verbalized understanding to arrive 15 min prior to appt.

## 2017-02-01 NOTE — Telephone Encounter (Signed)
TCM..the patient is coming on 02/12/17 to see Dr Kirke Corin

## 2017-02-01 NOTE — Progress Notes (Signed)
Progress Note  Patient Name: Julie [name] Date of Encounter: 02/01/2017  Primary Cardiologist: New   Subjective   She did reasonably well with furosemide and she feels back to baseline with almost complete resolution of shortness of breath and leg edema.  Inpatient Medications    Scheduled Meds: . docusate sodium  100 mg Oral BID  . furosemide  20 mg Intravenous BID  . levothyroxine  25 mcg Oral QAC breakfast  . prenatal multivitamin  1 tablet Oral Q1200  . sodium chloride flush  3 mL Intravenous Q12H   Continuous Infusions:  PRN Meds: sodium chloride, alum & mag hydroxide-simeth, bisacodyl, ibuprofen, magnesium citrate, magnesium hydroxide, ondansetron **OR** ondansetron (ZOFRAN) IV, oxyCODONE-acetaminophen, sodium chloride flush, zolpidem   Vital Signs    Vitals:   01/31/17 2046 02/01/17 0514 02/01/17 0844 02/01/17 0846  BP: 136/72 136/62 (!) 150/105 136/89  Pulse: 78 77 82 75  Resp: 16 16 14    Temp: 98.3 F (36.8 C) 97.9 F (36.6 C) 98.2 F (36.8 C)   TempSrc: Oral Oral Oral   SpO2: 95% 97% 98%   Weight:      Height:        Intake/Output Summary (Last 24 hours) at 02/01/17 0900 Last data filed at 02/01/17 3790  Gross per 24 hour  Intake              720 ml  Output             3900 ml  Net            -3180 ml   Filed Weights   01/31/17 0249  Weight: 268 lb 6.4 oz (121.7 kg)    Telemetry    Normal sinus rhythm with no significant arrhythmia - Personally Reviewed  ECG    Not performed today - Personally Reviewed  Physical Exam   GEN: No acute distress.   Neck: No JVD Cardiac: RRR, no murmurs, rubs, or gallops.  Respiratory: Clear to auscultation bilaterally. GI: Soft, nontender, non-distended  MS: Trace edema; No deformity. Neuro:  Nonfocal  Psych: Normal affect   Labs    Chemistry Recent Labs Lab 01/31/17 0232 01/31/17 1252 02/01/17 0452  NA 137 140 139  K 3.5 3.7 3.3*  CL 103 105 106  CO2 25 26 26   GLUCOSE 87 112* 85  BUN 10  11 16   CREATININE 0.56 0.66 0.56  CALCIUM 8.1* 8.6* 8.2*  PROT  --  6.9  --   ALBUMIN  --  2.7*  --   AST  --  59*  --   ALT  --  24  --   ALKPHOS  --  102  --   BILITOT  --  0.6  --   GFRNONAA >60 >60 >60  GFRAA >60 >60 >60  ANIONGAP 9 9 7      Hematology Recent Labs Lab 01/29/17 0518 01/30/17 2108 01/31/17 0232  WBC 10.2 8.9 9.2  RBC 3.15* 3.05* 3.32*  HGB 10.0* 9.5* 10.1*  HCT 27.3* 26.2* 28.8*  MCV 86.6 85.9 86.8  MCH 31.7 31.1 30.6  MCHC 36.6* 36.2* 35.2  RDW 13.7 13.1 13.4  PLT 244 315 336    Cardiac Enzymes Recent Labs Lab 01/30/17 2108 01/31/17 0232 01/31/17 0820 01/31/17 1252  TROPONINI <0.03 <0.03 0.04* <0.03   No results for input(s): TROPIPOC in the last 168 hours.   BNP Recent Labs Lab 01/30/17 2108  BNP 190.0*     DDimer No results for input(s): DDIMER  in the last 168 hours.   Radiology    Dg Chest 2 View  Result Date: 01/30/2017 CLINICAL DATA:  Dyspnea and upper back pain EXAM: CHEST  2 VIEW COMPARISON:  None. FINDINGS: Diffuse interstitial fluid or thickening. No consolidation. No significant effusion. Normal hilar and mediastinal contours. Upper normal heart size. IMPRESSION: Diffuse interstitial thickening, likely fluid. No alveolar edema or significant pleural effusion. Electronically Signed   By: Ellery Plunk M.D.   On: 01/30/2017 21:47   Ct Angio Chest Pe W And/or Wo Contrast  Result Date: 01/30/2017 CLINICAL DATA:  Awakened with dyspnea and lower extremity swelling. Recent Cesarean section. EXAM: CT ANGIOGRAPHY CHEST WITH CONTRAST TECHNIQUE: Multidetector CT imaging of the chest was performed using the standard protocol during bolus administration of intravenous contrast. Multiplanar CT image reconstructions and MIPs were obtained to evaluate the vascular anatomy. CONTRAST:  75 mL Isovue 370 intravenous COMPARISON:  None. FINDINGS: Cardiovascular: Satisfactory opacification of the pulmonary arteries to the segmental level. No evidence  of pulmonary embolism. Normal heart size. No pericardial effusion. Mediastinum/Nodes: No enlarged mediastinal, hilar, or axillary lymph nodes. Thyroid gland, trachea, and esophagus demonstrate no significant findings. Lungs/Pleura: Diffuse interstitial fluid. Mild central alveolar fluid. No focal consolidation. Airways are patent and normal in caliber. Small pleural effusions bilaterally. Upper Abdomen: No acute abnormality. Musculoskeletal: No chest wall abnormality. No acute or significant osseous findings. Review of the MIP images confirms the above findings. IMPRESSION: Negative for pulmonary embolism. There is interstitial and alveolar fluid as well as pleural effusions, with appearances most consistent with fluid overload or CHF. Electronically Signed   By: Ellery Plunk M.D.   On: 01/30/2017 22:54    Cardiac Studies   Echocardiogram was personally reviewed by me which showed normal LV systolic function, moderate mitral regurgitation although the valve itself was not visualized, mildly dilated left atrium, moderate to severe pulmonary hypertension and most likely grade 2 diastolic dysfunction  Patient Profile     21 y.o. female who presented with shortness of breath and leg edema 3 days after delivering a healthy baby. She was found to be in congestive heart failure with volume overload. CTA of the chest showed no evidence of pulmonary embolism.  Assessment & Plan    1. Acute diastolic heart failure: Echocardiogram showed no evidence of cardiomyopathy but there was grade 2 diastolic dysfunction with moderate mitral regurgitation and moderate to severe hypertension. I suspect that most likely she had volume overload with elevated blood pressure which led to the above findings. She improved quickly with diuresis. She feels close to baseline. She probably can be discharged home later today on Lasix 20 mg once daily for 5 days only. I will arrange for follow-up in our office.  2. Mitral  regurgitation with pulmonary hypertension: I do not appreciate any cardiac murmurs by exam today. Most likely this is a dynamic mitral regurgitation due to elevated blood pressure and volume overload. I do recommend repeat echocardiogram in about one month from now to ensure resolution of both these issues.  Signed, Lorine Bears, MD  02/01/2017, 9:00 AM

## 2017-02-01 NOTE — Telephone Encounter (Signed)
No answer. Left message to call back.   

## 2017-02-12 ENCOUNTER — Encounter: Payer: Medicaid Other | Admitting: Cardiovascular Disease

## 2017-02-15 ENCOUNTER — Encounter: Payer: Medicaid Other | Admitting: Cardiovascular Disease

## 2017-03-04 ENCOUNTER — Encounter: Payer: Self-pay | Admitting: Cardiovascular Disease

## 2017-03-04 ENCOUNTER — Ambulatory Visit (INDEPENDENT_AMBULATORY_CARE_PROVIDER_SITE_OTHER): Payer: Medicaid Other | Admitting: Cardiovascular Disease

## 2017-03-04 VITALS — BP 118/82 | HR 78 | Ht 64.0 in | Wt 246.2 lb

## 2017-03-04 DIAGNOSIS — I340 Nonrheumatic mitral (valve) insufficiency: Secondary | ICD-10-CM

## 2017-03-04 DIAGNOSIS — I272 Pulmonary hypertension, unspecified: Secondary | ICD-10-CM | POA: Diagnosis not present

## 2017-03-04 NOTE — Progress Notes (Signed)
Cardiology Office Note   Date:  03/04/2017   ID:  [name], DOB Apr 03, 1996, MRN [id number]  PCP:  [facility]  Cardiologist:   Lorine Bears, MD   Chief Complaint  Patient presents with  . other    Follow up from [facility]; shortness of breath and LE edema. Meds reviewed by the pt. verbally. "doing well."       History of Present Illness: Julie [name] is a 21 y.o. female who presents for A follow-up visit regarding diastolic heart failure and mitral regurgitation. The patient was hospitalized last month at [facility] for shortness of breath and leg edema 3 days after delivery. She had no major problems during pregnancy except for gestational diabetes. She was treated with IV furosemide with improvement in symptoms. There was concerns about peri-partum cardiomyopathy. She underwent an echocardiogram which showed an EF of 60-65%, moderate mitral regurgitation, moderate tricuspid regurgitation and moderate pulmonary hypertension with peak systolic pressure of 57 mmHg. It was felt that her heart failure was due to elevated blood pressure. She was discharged home on furosemide 20 mg daily for 5 days.  She lost 22 pounds since her hospital discharge. She has been doing well and denies any chest pain, shortness of breath or palpitations. She feels back to normal.    Past Medical History:  Diagnosis Date  . Gestational diabetes 10/06/2016  . Headache(784.0) 10/06/2004  . History of C-section 01/28/2017    Past Surgical History:  Procedure Laterality Date  . CESAREAN SECTION N/A 01/28/2017   Procedure: CESAREAN SECTION;  Surgeon: Suzy Bouchard, MD;  Location: [facility] ORS;  Service: Obstetrics;  Laterality: N/A;  . TONSILLECTOMY Bilateral 10/06/2016     Current Outpatient Prescriptions  Medication Sig Dispense Refill  . ascorbic acid (VITAMIN C) 250 MG tablet Take 1 tablet (250 mg total) by mouth 2 (two) times daily with a meal. Take with iron for anemia 60 tablet 3  .  docusate sodium (COLACE) 100 MG capsule Take 1 capsule (100 mg total) by mouth daily as needed for mild constipation. 60 capsule 3  . levothyroxine (SYNTHROID, LEVOTHROID) 25 MCG tablet Take 1 tablet (25 mcg total) by mouth daily before breakfast. 30 tablet 0   No current facility-administered medications for this visit.     Allergies:   Patient has no known allergies.    Social History:  The patient  reports that she has quit smoking. Her smoking use included Cigarettes. She smoked 0.25 packs per day. She has never used smokeless tobacco. She reports that she uses drugs, including Marijuana and Other-see comments, about 2 times per week. She reports that she does not drink alcohol.   Family History:  The patient's family history includes Anxiety disorder in her maternal grandmother and mother; Depression in her maternal grandmother and mother; Diabetes in her mother; Hyperlipidemia in her mother; Hypertension in her mother; Migraines in her maternal aunt, maternal grandmother, and maternal uncle.    ROS:  Please see the history of present illness.   Otherwise, review of systems are positive for none.   All other systems are reviewed and negative.    PHYSICAL EXAM: VS:  BP 118/82 (BP Location: Left Arm, Patient Position: Sitting, Cuff Size: Normal)   Pulse 78   Ht 5\' 4"  (1.626 m)   Wt 246 lb 4 oz (111.7 kg)   BMI 42.27 kg/m  , BMI Body mass index is 42.27 kg/m. GEN: Well nourished, well developed, in no acute distress  HEENT: normal  Neck: no JVD, carotid bruits, or masses Cardiac: RRR; no murmurs, rubs, or gallops,no edema  Respiratory:  clear to auscultation bilaterally, normal work of breathing GI: soft, nontender, nondistended, + BS MS: no deformity or atrophy  Skin: warm and dry, no rash Neuro:  Strength and sensation are intact Psych: euthymic mood, full affect   EKG:  EKG is ordered today. The ekg ordered today demonstrates normal sinus rhythm   Recent Labs: 01/30/2017:  B Natriuretic Peptide 190.0 01/31/2017: ALT 24; Hemoglobin 10.1; Platelets 336; TSH 5.180 02/01/2017: BUN 16; Creatinine, Ser 0.56; Potassium 3.3; Sodium 139    Lipid Panel    Component Value Date/Time   CHOL 148 01/31/2017 0232   TRIG 191 (H) 01/31/2017 0232   HDL 45 01/31/2017 0232   CHOLHDL 3.3 01/31/2017 0232   VLDL 38 01/31/2017 0232   LDLCALC 65 01/31/2017 0232      Wt Readings from Last 3 Encounters:  03/04/17 246 lb 4 oz (111.7 kg) (>99 %, Z > 2.33)*  01/31/17 268 lb 6.4 oz (121.7 kg) (>99 %, Z > 2.33)*  01/25/17 268 lb (121.6 kg) (>99 %, Z > 2.33)*   * Growth percentiles are based on CDC 2-20 Years data.       No flowsheet data found.    ASSESSMENT AND PLAN:  1.  Postpartum diastolic heart failure: Likely was triggered by hypertension which has resolved completely. The patient is back to normal with no residual symptoms.  2. Moderate mitral and tricuspid regurgitation with moderate pulmonary hypertension: This was in the setting of her hospitalization. I'm going to repeat an echocardiogram to ensure that these have resolved.     Disposition:   FU with me as needed.   Signed,  Lorine Bears, MD  03/04/2017 2:55 PM    [facility]

## 2017-03-04 NOTE — Patient Instructions (Addendum)
Medication Instructions:  Your physician recommends that you continue on your current medications as directed. Please refer to the Current Medication list given to you today.   Labwork: none  Testing/Procedures: Your physician has requested that you have an echocardiogram. Echocardiography is a painless test that uses sound waves to create images of your heart. It provides your doctor with information about the size and shape of your heart and how well your heart's chambers and valves are working. This procedure takes approximately one hour. There are no restrictions for this procedure.    Follow-Up: Your physician recommends that you schedule a follow-up appointment as needed.    Any Other Special Instructions Will Be Listed Below (If Applicable).     If you need a refill on your cardiac medications before your next appointment, please call your pharmacy.  Echocardiogram An echocardiogram, or echocardiography, uses sound waves (ultrasound) to produce an image of your heart. The echocardiogram is simple, painless, obtained within a short period of time, and offers valuable information to your health care provider. The images from an echocardiogram can provide information such as:  Evidence of coronary artery disease (CAD).  Heart size.  Heart muscle function.  Heart valve function.  Aneurysm detection.  Evidence of a past heart attack.  Fluid buildup around the heart.  Heart muscle thickening.  Assess heart valve function.  Tell a health care provider about:  Any allergies you have.  All medicines you are taking, including vitamins, herbs, eye drops, creams, and over-the-counter medicines.  Any problems you or family members have had with anesthetic medicines.  Any blood disorders you have.  Any surgeries you have had.  Any medical conditions you have.  Whether you are pregnant or may be pregnant. What happens before the procedure? No special preparation is  needed. Eat and drink normally. What happens during the procedure?  In order to produce an image of your heart, gel will be applied to your chest and a wand-like tool (transducer) will be moved over your chest. The gel will help transmit the sound waves from the transducer. The sound waves will harmlessly bounce off your heart to allow the heart images to be captured in real-time motion. These images will then be recorded.  You may need an IV to receive a medicine that improves the quality of the pictures. What happens after the procedure? You may return to your normal schedule including diet, activities, and medicines, unless your health care provider tells you otherwise. This information is not intended to replace advice given to you by your health care provider. Make sure you discuss any questions you have with your health care provider. Document Released: 12/11/2000 Document Revised: 08/01/2016 Document Reviewed: 08/21/2013 Elsevier Interactive Patient Education  2017 [employer] Inc.  

## 2017-04-02 ENCOUNTER — Encounter: Payer: Self-pay | Admitting: Cardiovascular Disease

## 2017-04-02 ENCOUNTER — Other Ambulatory Visit: Payer: Medicaid Other

## 2017-04-09 ENCOUNTER — Telehealth: Payer: Self-pay | Admitting: Cardiovascular Disease

## 2017-04-09 NOTE — Telephone Encounter (Signed)
lmov to schedule repeat echo

## 2017-04-09 NOTE — Telephone Encounter (Signed)
Pt already scheduled for 05/17/17

## 2017-05-17 ENCOUNTER — Other Ambulatory Visit: Payer: Medicaid Other

## 2017-05-17 ENCOUNTER — Encounter: Payer: Self-pay | Admitting: Cardiovascular Disease

## 2017-06-01 ENCOUNTER — Telehealth: Payer: Self-pay | Admitting: Cardiovascular Disease

## 2017-06-01 NOTE — Telephone Encounter (Signed)
Pt no showed for echo x 2. S/w pt's mom, April (on [place]), who states she started a new job and left it up to the pt to make it to the appointment.   April would like to reschedule the echo appt during a time in which she can accompany the patient. Transferred to scheduling.

## 2017-07-02 ENCOUNTER — Other Ambulatory Visit: Payer: Medicaid Other

## 2017-11-18 IMAGING — CR DG CHEST 2V
1 series · 2 of 2 positions shown · non-contrast
Comparison: None.

CLINICAL DATA: Dyspnea and upper back pain

EXAM:
CHEST  2 VIEW

[Series 1: dg chest 2 view · 0.14mm/px · 2 of 2 slices shown]
[im 1/2]
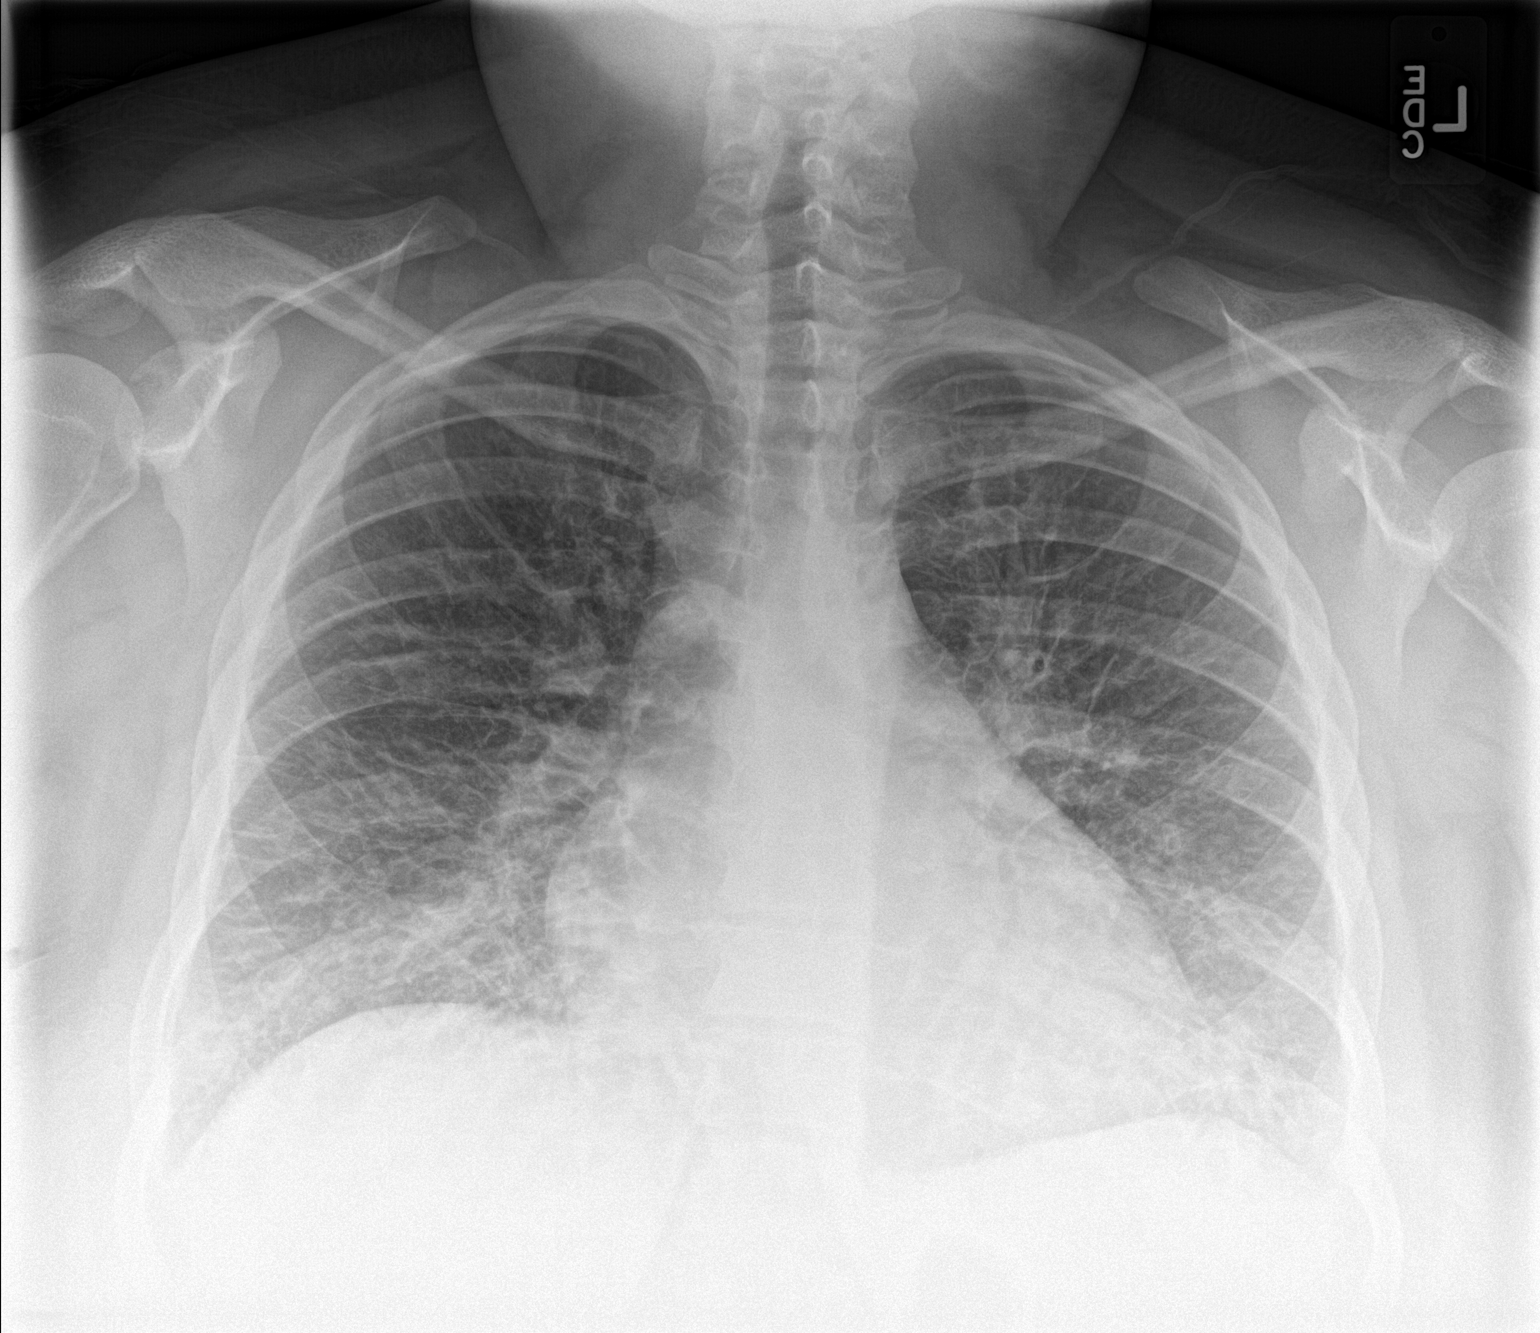
[im 2/2]
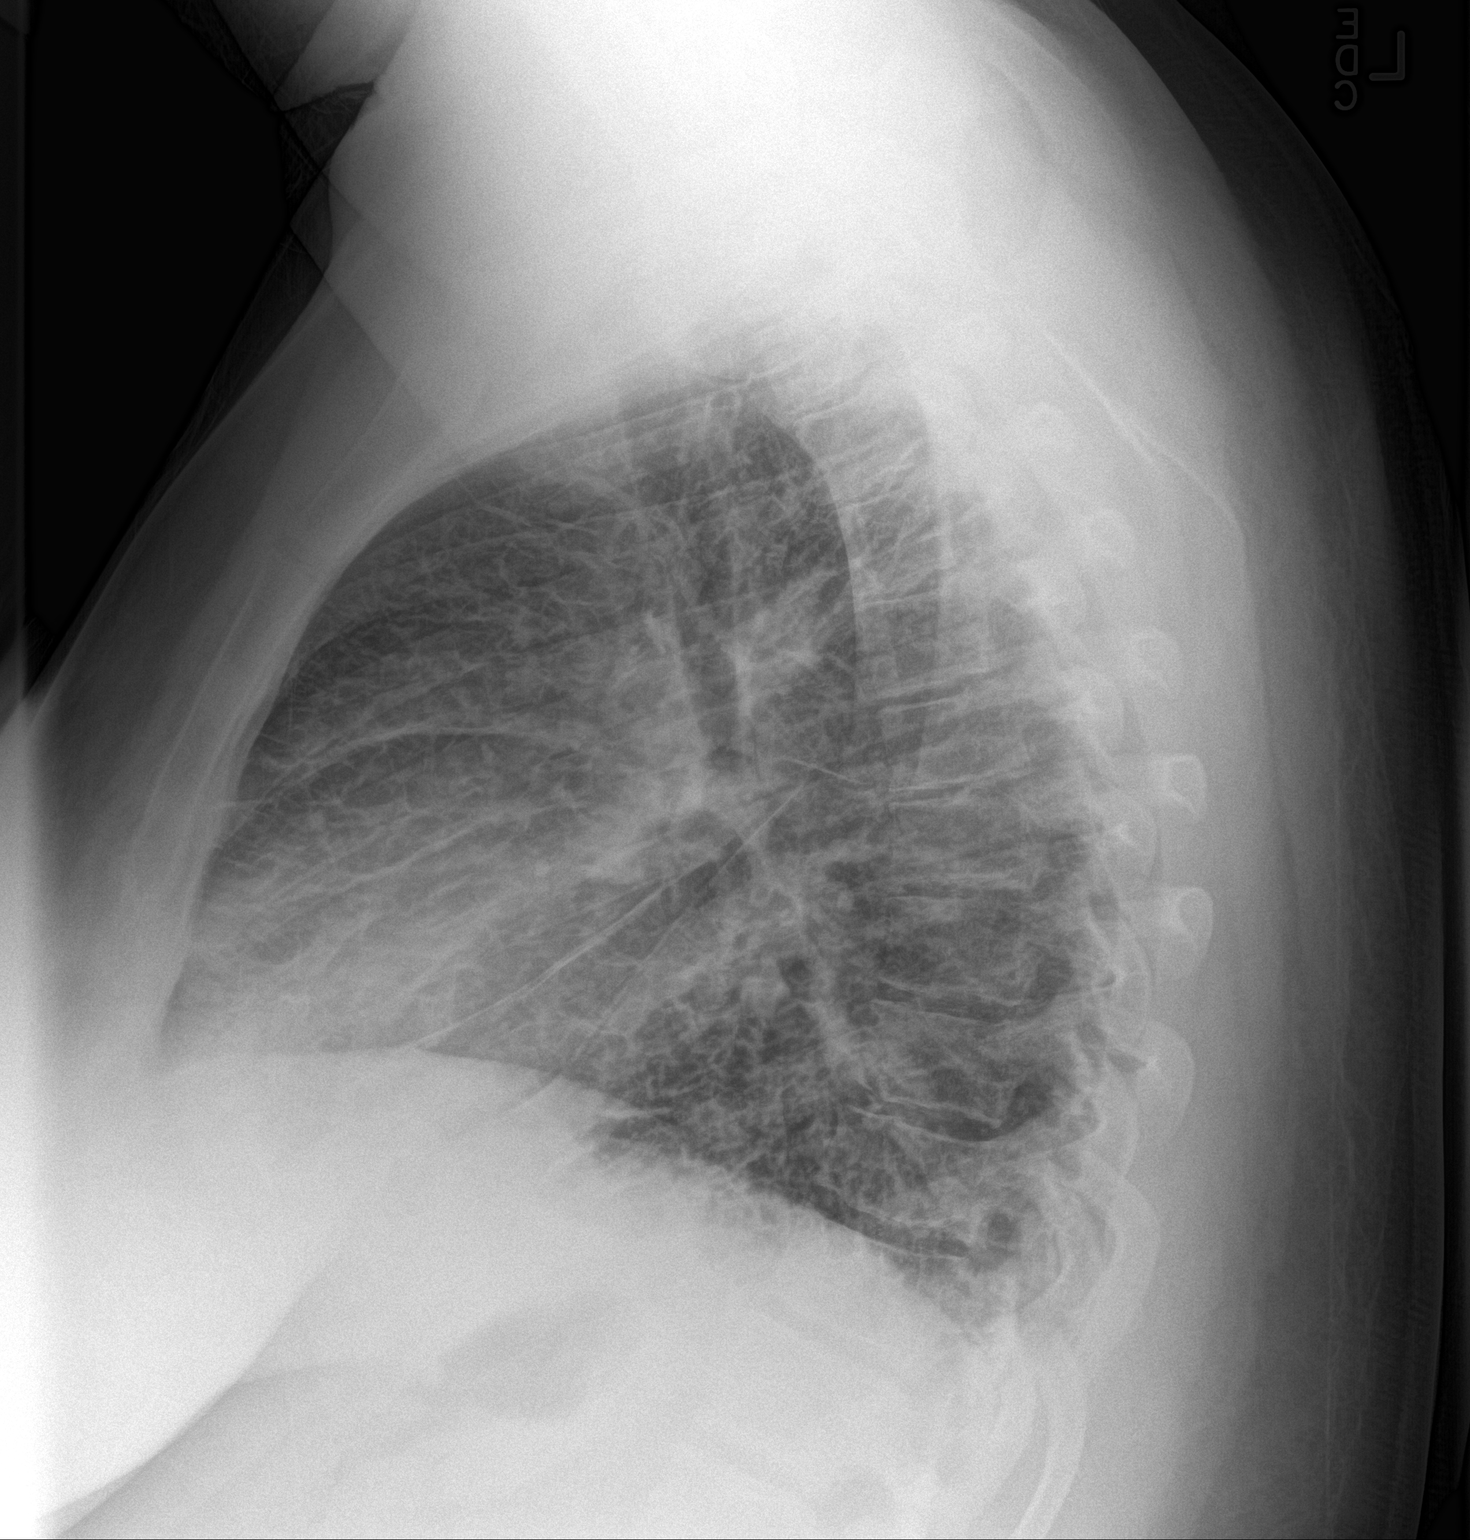

[2 of 2 positions shown; findings below may reference images not displayed]

FINDINGS: Diffuse interstitial fluid or thickening. No consolidation. No
significant effusion. Normal hilar and mediastinal contours. Upper
normal heart size.
IMPRESSION: Diffuse interstitial thickening, likely fluid. No alveolar edema or
significant pleural effusion.

## 2018-09-21 ENCOUNTER — Other Ambulatory Visit
Admission: RE | Admit: 2018-09-21 | Discharge: 2018-09-21 | Disposition: A | Discharge status: Home / Self Care | Payer: Medicaid Other | Source: Ambulatory Visit | Attending: Obstetrics and Gynecology | Admitting: Obstetrics and Gynecology

## 2018-09-21 DIAGNOSIS — O0990 Supervision of high risk pregnancy, unspecified, unspecified trimester: Secondary | ICD-10-CM | POA: Diagnosis present

## 2018-09-21 DIAGNOSIS — I110 Hypertensive heart disease with heart failure: Secondary | ICD-10-CM | POA: Diagnosis not present

## 2018-09-21 DIAGNOSIS — I5030 Unspecified diastolic (congestive) heart failure: Secondary | ICD-10-CM | POA: Insufficient documentation

## 2018-09-21 LAB — BRAIN NATRIURETIC PEPTIDE: B NATRIURETIC PEPTIDE 5: 17 pg/mL (ref 0.0–100.0)

## 2018-09-25 ENCOUNTER — Emergency Department: Payer: Medicaid Other

## 2018-09-25 ENCOUNTER — Emergency Department
Admission: EM | Admit: 2018-09-25 | Discharge: 2018-09-25 | Disposition: A | Discharge status: Home / Self Care | Payer: Medicaid Other | Attending: Emergency Medicine | Admitting: Emergency Medicine

## 2018-09-25 ENCOUNTER — Encounter: Payer: Self-pay | Admitting: Emergency Medicine

## 2018-09-25 ENCOUNTER — Other Ambulatory Visit: Payer: Self-pay

## 2018-09-25 DIAGNOSIS — O99810 Abnormal glucose complicating pregnancy: Secondary | ICD-10-CM | POA: Insufficient documentation

## 2018-09-25 DIAGNOSIS — O9989 Other specified diseases and conditions complicating pregnancy, childbirth and the puerperium: Secondary | ICD-10-CM | POA: Diagnosis present

## 2018-09-25 DIAGNOSIS — Z3A08 8 weeks gestation of pregnancy: Secondary | ICD-10-CM | POA: Diagnosis not present

## 2018-09-25 DIAGNOSIS — R102 Pelvic and perineal pain: Secondary | ICD-10-CM | POA: Diagnosis not present

## 2018-09-25 DIAGNOSIS — Z79899 Other long term (current) drug therapy: Secondary | ICD-10-CM | POA: Insufficient documentation

## 2018-09-25 DIAGNOSIS — Z87891 Personal history of nicotine dependence: Secondary | ICD-10-CM | POA: Insufficient documentation

## 2018-09-25 DIAGNOSIS — O034 Incomplete spontaneous abortion without complication: Secondary | ICD-10-CM | POA: Insufficient documentation

## 2018-09-25 LAB — CBC
HEMATOCRIT: 33.5 % — AB (ref 35.0–47.0)
Hemoglobin: 11.7 g/dL — ABNORMAL LOW (ref 12.0–16.0)
MCH: 31.1 pg (ref 26.0–34.0)
MCHC: 35 g/dL (ref 32.0–36.0)
MCV: 88.7 fL (ref 80.0–100.0)
PLATELETS: 425 10*3/uL (ref 150–440)
RBC: 3.78 MIL/uL — ABNORMAL LOW (ref 3.80–5.20)
RDW: 12 % (ref 11.5–14.5)
WBC: 12.9 10*3/uL — ABNORMAL HIGH (ref 3.6–11.0)

## 2018-09-25 LAB — TYPE AND SCREEN
ABO/RH(D): O POS
ANTIBODY SCREEN: NEGATIVE

## 2018-09-25 LAB — BASIC METABOLIC PANEL
Anion gap: 10 (ref 5–15)
BUN: 12 mg/dL (ref 6–20)
CALCIUM: 8.7 mg/dL — AB (ref 8.9–10.3)
CO2: 21 mmol/L — AB (ref 22–32)
CREATININE: 0.56 mg/dL (ref 0.44–1.00)
Chloride: 105 mmol/L (ref 98–111)
GFR calc non Af Amer: >60 mL/min (ref >60)
GLUCOSE: 130 mg/dL — AB (ref 70–99)
Potassium: 3.4 mmol/L — ABNORMAL LOW (ref 3.5–5.1)
Sodium: 136 mmol/L (ref 135–145)

## 2018-09-25 LAB — HCG, QUANTITATIVE, PREGNANCY: HCG, BETA CHAIN, QUANT, S: 70360 m[IU]/mL — AB (ref <5)

## 2018-09-25 NOTE — ED Notes (Signed)
Pt reports she is feeling a little light headed. Charge RN called for bed. VS checked and pt noted to be hypotensive as charted.

## 2018-09-25 NOTE — ED Notes (Signed)
Report recieved 

## 2018-09-25 NOTE — ED Triage Notes (Signed)
Pt arrives POV to triage with c/o heavy vaginal bleeding since 2330 last night. Pt reports that she was given Morplex at [facility] abortion clinic to induce abortion. Pt reports that she was 8 weeks and 5 days pregnant. Pt also reports going through x 3 pads an hour and the pamphlet said to go to the ED if this occurred. Pt is in NAD.

## 2018-09-25 NOTE — ED Provider Notes (Signed)
[facility] Emergency Department Provider Note  ____________________________________________   First MD Initiated Contact with Patient 09/25/18 0309     (approximate)  I have reviewed the triage vital signs and the nursing notes.   HISTORY  Chief Complaint Vaginal Bleeding    HPI Julie [name] is a 22 y.o. female G2P1 at about 8.[redacted] weeks gestation who presents for heavy vaginal bleeding after taking a medication at an abortion clinic in [place] in order to abort her early pregnancy.  She was told that if she has heavy vaginal bleeding she should go to the nearest emergency department.  She started having a little bit of bleeding almost immediately after taking the medication but tonight she had acute onset of more severe bleeding was going through 3 pads an hour.  She is having some intermittent lower abdominal cramping but it is not severe.  She felt a little bit weak earlier when her blood pressure drops but she feels normal now.  She denies fever/chills, chest pain, shortness of breath, nausea, vomiting, and dysuria.  Nothing particular makes it better or worse.   Past Medical History:  Diagnosis Date  . Gestational diabetes 10/06/2016  . Headache(784.0) 10/06/2004  . History of C-section 01/28/2017    Patient Active Problem List   Diagnosis Date Noted  . Pleural effusion in other conditions classified elsewhere 01/31/2017  . Pulmonary edema cardiac cause (HCC) 01/31/2017  . Post-operative state 01/28/2017  . Gestational diabetes 01/25/2017  . Supervision of high risk pregnancy in third trimester 12/24/2016  . Insulin dependent gestational diabetes mellitus (GDM), antepartum (HCC) 12/11/2016  . Gestational diabetes mellitus 12/07/2016  . First trimester screening 08/06/2016  . Migraine with aura, without mention of intractable migraine without mention of status migrainosus 07/17/2013    Past Surgical History:  Procedure Laterality Date  .  CESAREAN SECTION N/A 01/28/2017   Procedure: CESAREAN SECTION;  Surgeon: Suzy Bouchard, MD;  Location: [facility] ORS;  Service: Obstetrics;  Laterality: N/A;  . TONSILLECTOMY Bilateral 10/06/2016    Prior to Admission medications   Medication Sig Start Date End Date Taking? Authorizing Provider  docusate sodium (COLACE) 100 MG capsule Take 1 capsule (100 mg total) by mouth daily as needed for mild constipation. 01/30/17   Christeen Douglas, MD  levothyroxine (SYNTHROID, LEVOTHROID) 25 MCG tablet Take 1 tablet (25 mcg total) by mouth daily before breakfast. 02/01/17   Katha Hamming, MD    Allergies Patient has no known allergies.  Family History  Problem Relation Age of Onset  . Anxiety disorder Mother   . Depression Mother   . Diabetes Mother   . Hyperlipidemia Mother   . Hypertension Mother   . Migraines Maternal Grandmother   . Anxiety disorder Maternal Grandmother   . Depression Maternal Grandmother   . Migraines Maternal Aunt   . Migraines Maternal Uncle     Social History Social History   Tobacco Use  . Smoking status: Former Smoker    Packs/day: 0.25    Types: Cigarettes  . Smokeless tobacco: Never Used  . Tobacco comment: quit since pregnancy  Substance Use Topics  . Alcohol use: No  . Drug use: Yes    Frequency: 2.0 times per week    Types: Marijuana, Other-see comments    Comment: last time used was today    Review of Systems Constitutional: No fever/chills Eyes: No visual changes. ENT: No sore throat. Cardiovascular: Denies chest pain. Respiratory: Denies shortness of breath. Gastrointestinal: No abdominal pain.  No nausea, no vomiting.  No diarrhea.  No constipation. Genitourinary: Vaginal bleeding in the setting of a medication induced abortion within the last 24 hours or so.  Negative for dysuria. Musculoskeletal: Negative for neck pain.  Negative for back pain. Integumentary: Negative for rash. Neurological: Negative for headaches, focal  weakness or numbness.   ____________________________________________   PHYSICAL EXAM:  VITAL SIGNS: ED Triage Vitals  Enc Vitals Group     BP 09/25/18 0204 139/89     Pulse Rate 09/25/18 0204 (!) 110     Resp 09/25/18 0204 18     Temp 09/25/18 0204 98.6 F (37 C)     Temp Source 09/25/18 0204 Oral     SpO2 09/25/18 0204 98 %     Weight 09/25/18 0206 112.9 kg (249 lb)     Height 09/25/18 0206 1.727 m (5\' 8" )     Head Circumference --      Peak Flow --      Pain Score 09/25/18 0206 8     Pain Loc --      Pain Edu? --      Excl. in GC? --     Constitutional: Alert and oriented. Well appearing and in no acute distress. Eyes: Conjunctivae are normal.  Head: Atraumatic. Nose: No congestion/rhinnorhea. Mouth/Throat: Mucous membranes are moist. Neck: No stridor.  No meningeal signs.   Cardiovascular: Normal rate, regular rhythm. Good peripheral circulation. Grossly normal heart sounds. Respiratory: Normal respiratory effort.  No retractions. Lungs CTAB. Gastrointestinal: Soft and nontender. No distention.  Genitourinary: No external abnormalities.  Cervix is very difficult to visualize but I was not able to see any evidence of retained products in the cervical os.  Nontender digital exam, but again, the position of the cervix made it impossible for me to directly palpate the office.  ED chaperone present throughout exam. Musculoskeletal: No lower extremity tenderness nor edema. No gross deformities of extremities. Neurologic:  Normal speech and language. No gross focal neurologic deficits are appreciated.  Skin:  Skin is slightly pale for her race, warm, dry and intact. No rash noted. Psychiatric: Mood and affect are normal. Speech and behavior are normal.  ____________________________________________   LABS (all labs ordered are listed, but only abnormal results are displayed)  Labs Reviewed  HCG, QUANTITATIVE, PREGNANCY - Abnormal; Notable for the following components:       Result Value   hCG, Beta Chain, Quant, S 70,360 (*)    All other components within normal limits  CBC - Abnormal; Notable for the following components:   WBC 12.9 (*)    RBC 3.78 (*)    Hemoglobin 11.7 (*)    HCT 33.5 (*)    All other components within normal limits  BASIC METABOLIC PANEL - Abnormal; Notable for the following components:   Potassium 3.4 (*)    CO2 21 (*)    Glucose, Bld 130 (*)    Calcium 8.7 (*)    All other components within normal limits  TYPE AND SCREEN   ____________________________________________  EKG  None - EKG not ordered by ED physician ____________________________________________  RADIOLOGY   ED MD interpretation: No indication for imaging  Official radiology report(s): US Ob Comp < 14 Wks  Result Date: 09/25/2018 CLINICAL DATA:  22 year old pregnant female with vaginal bleeding. Reported attempt to induce abortion. EXAM: OBSTETRIC <14 WK [place] AND TRANSVAGINAL OB US TECHNIQUE: Both transabdominal and transvaginal ultrasound examinations were performed for complete evaluation of the gestation as well as the maternal  uterus, adnexal regions, and pelvic cul-de-sac. Transvaginal technique was performed to assess early pregnancy. COMPARISON:  None. FINDINGS: The uterus is anteverted. The endometrium is thickened and heterogeneous measuring 2.7 cm thickness. No intrauterine pregnancy identified. Large heterogeneous content within the endometrium extending into the cervical canal most likely represent blood product/clot. Retained products of conception is not excluded. Please note as there is no prior documented intrauterine pregnancy, findings consistent with pregnancy of unknown location in this patient with positive HCG levels. Differential diagnosis include recent abortion, early pregnancy, or an ectopic pregnancy. Clinical correlation and follow-up with serial HCG levels and ultrasound recommended. Maternal uterus/adnexae: The right ovary is unremarkable and  measures 3.1 x 1.6 x 2.6 cm. There is a 4.3 x 4.1 x 4.2 cm simple cyst in the left ovary. IMPRESSION: 1. No intrauterine pregnancy.  See above discussion. 2. Large amount of blood product/clot versus retained products of conception. Clinical correlation is recommended. 3. Left ovarian cyst. Electronically Signed   By: Elgie Collard M.D.   On: 09/25/2018 06:06   US Ob Transvaginal  Result Date: 09/25/2018 CLINICAL DATA:  22 year old pregnant female with vaginal bleeding. Reported attempt to induce abortion. EXAM: OBSTETRIC <14 WK [place] AND TRANSVAGINAL OB US TECHNIQUE: Both transabdominal and transvaginal ultrasound examinations were performed for complete evaluation of the gestation as well as the maternal uterus, adnexal regions, and pelvic cul-de-sac. Transvaginal technique was performed to assess early pregnancy. COMPARISON:  None. FINDINGS: The uterus is anteverted. The endometrium is thickened and heterogeneous measuring 2.7 cm thickness. No intrauterine pregnancy identified. Large heterogeneous content within the endometrium extending into the cervical canal most likely represent blood product/clot. Retained products of conception is not excluded. Please note as there is no prior documented intrauterine pregnancy, findings consistent with pregnancy of unknown location in this patient with positive HCG levels. Differential diagnosis include recent abortion, early pregnancy, or an ectopic pregnancy. Clinical correlation and follow-up with serial HCG levels and ultrasound recommended. Maternal uterus/adnexae: The right ovary is unremarkable and measures 3.1 x 1.6 x 2.6 cm. There is a 4.3 x 4.1 x 4.2 cm simple cyst in the left ovary. IMPRESSION: 1. No intrauterine pregnancy.  See above discussion. 2. Large amount of blood product/clot versus retained products of conception. Clinical correlation is recommended. 3. Left ovarian cyst. Electronically Signed   By: Elgie Collard M.D.   On: 09/25/2018 06:06     ____________________________________________   PROCEDURES  Critical Care performed: No   Procedure(s) performed:   Procedures   ____________________________________________   INITIAL IMPRESSION / ASSESSMENT AND PLAN / ED COURSE  As part of my medical decision making, I reviewed the following data within the electronic MEDICAL RECORD NUMBER Nursing notes reviewed and incorporated, Labs reviewed , Old chart reviewed and Notes from prior ED visits    Differential diagnosis includes, but is not limited to, retained products of conception, uterine abruption, other nonspecific complication of medication induced abortion.  The patient's vital signs are stable and I think she may have had a vasovagal episode when she became acutely lightheaded, pale, and hypotensive.  All that has improved.  She received 1 L normal saline.  Speculum exam was reassuring with some blood in the vaginal vault but no heavy active bleeding and no evidence of retained products.  I will obtain an ultrasound for further evaluation.  Metabolic panel and CBC are all reassuring with a hemoglobin of 11.7.  Patient is O+ and does not need RhoGam.  Clinical Course as of Sep 25 749  Wynelle Link Sep 25, 2018  0713 The patient has been asymptomatic other than some mild vaginal bleeding since the initial assessment.  She has been sleeping comfortably and has normal and stable vital signs.  Lab work is all reassuring with a normal hemoglobin and normal metabolic panel.  Ultrasound demonstrates incomplete abortion which is clinically consistent.  Patient is having no pain, no dizziness, etc.  We will have the patient ambulate but as long as she is able to ambulate without any distress she will follow-up as scheduled with her clinic in [place] and/or with [facility] with whom she has a prior patient relationship.  I gave my usual customary return precautions.   [CF]    Clinical Course User Index [CF] Loleta Rose, MD     ____________________________________________  FINAL CLINICAL IMPRESSION(S) / ED DIAGNOSES  Final diagnoses:  Incomplete abortion     MEDICATIONS GIVEN DURING THIS VISIT:  Medications - No data to display   ED Discharge Orders    None       Note:  This document was prepared using Dragon voice recognition software and may include unintentional dictation errors.    Loleta Rose, MD 09/25/18 [phone]

## 2018-09-25 NOTE — ED Notes (Signed)
Pt back from US

## 2018-09-25 NOTE — Discharge Instructions (Signed)
Your work-up was reassuring today.  We believe that your body has simply not yet completed the process of having an abortion.  Please continue to drink plenty of fluids and use any medications you were prescribed previously.  You may also use over-the-counter pain medication such as ibuprofen and Tylenol according to label instructions.  Follow-up either at the clinic in [place] that you saw or at [facility] clinic OB[facility]GYN.  You should have repeat blood work to make sure that your blood pregnancy test is going down.  You may also benefit from a repeat ultrasound.    Return to the emergency department if you develop new or worsening symptoms that concern you.

## 2019-06-24 ENCOUNTER — Other Ambulatory Visit: Payer: Self-pay | Admitting: Family Medicine

## 2019-06-24 DIAGNOSIS — Z20822 Contact with and (suspected) exposure to covid-19: Secondary | ICD-10-CM

## 2019-07-01 LAB — NOVEL CORONAVIRUS, NAA: SARS-CoV-2, NAA: NOT DETECTED

## 2019-07-14 IMAGING — US US OB COMP LESS 14 WK
1 series · 13 of 28 positions shown · non-contrast
Comparison: None.

CLINICAL DATA: 21-year-old pregnant female with vaginal bleeding.
Reported attempt to induce abortion.

EXAM:
OBSTETRIC <14 WK US AND TRANSVAGINAL OB US
TECHNIQUE: Both transabdominal and transvaginal ultrasound examinations were
performed for complete evaluation of the gestation as well as the
maternal uterus, adnexal regions, and pelvic cul-de-sac.
Transvaginal technique was performed to assess early pregnancy.

[Series 1: us ob comp less 14 wk · 13 of 100 slices shown]
[im 4/100]
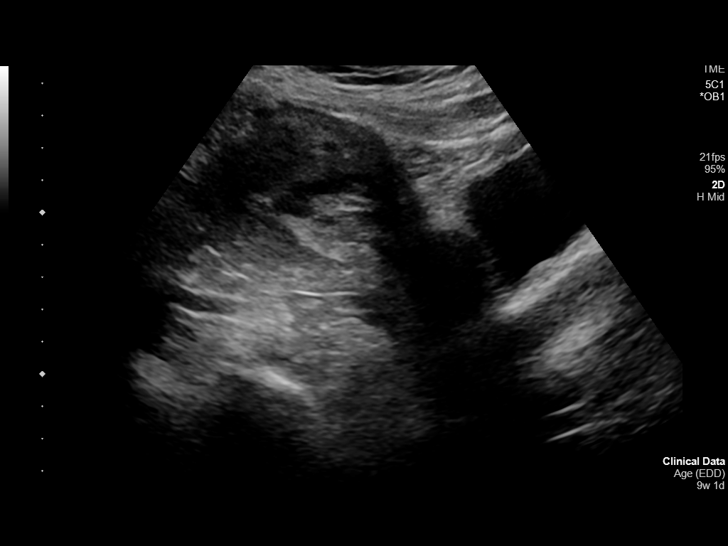
[im 12/100]
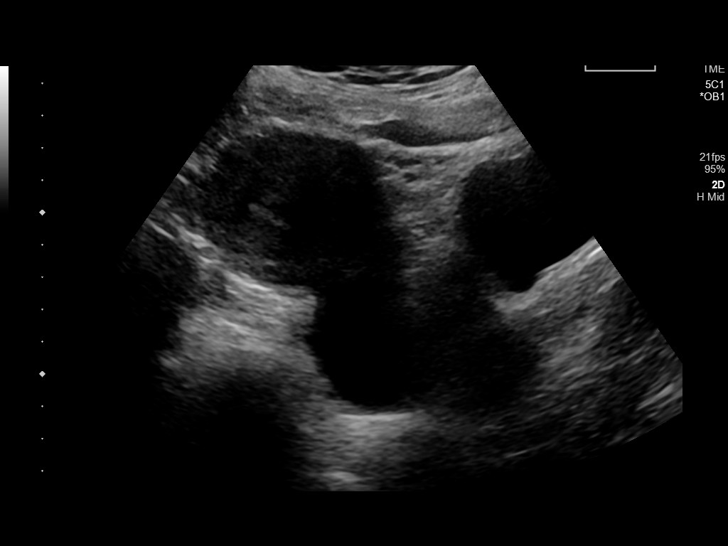
[im 19/100]
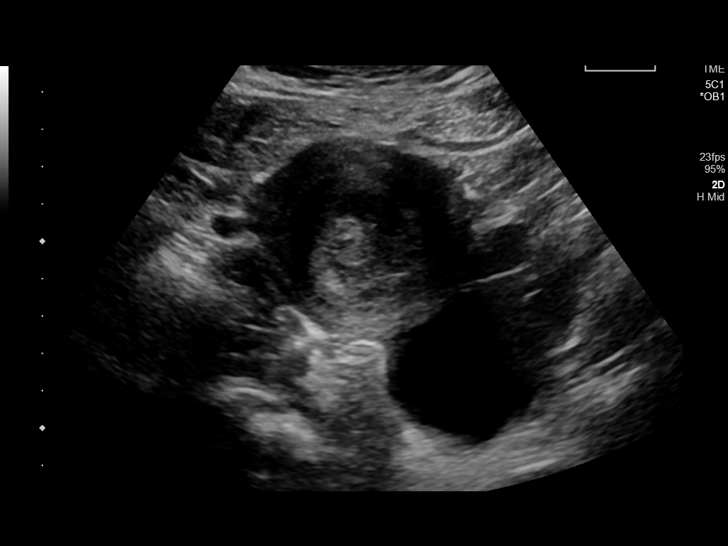
[im 26/100]
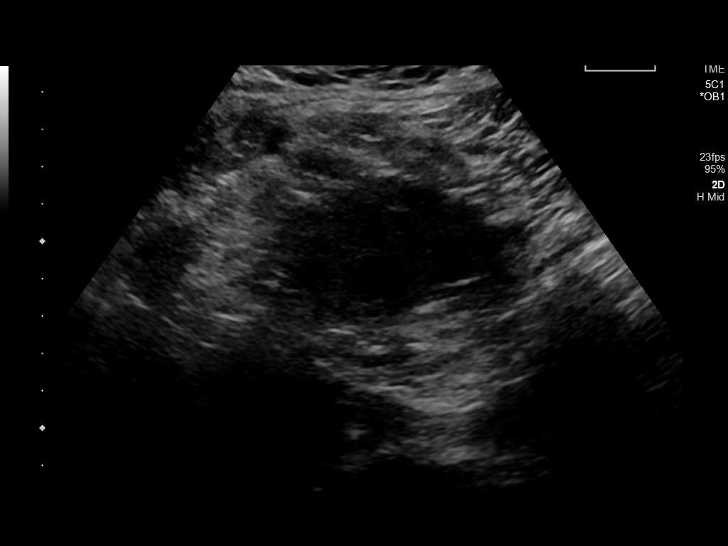
[im 34/100]
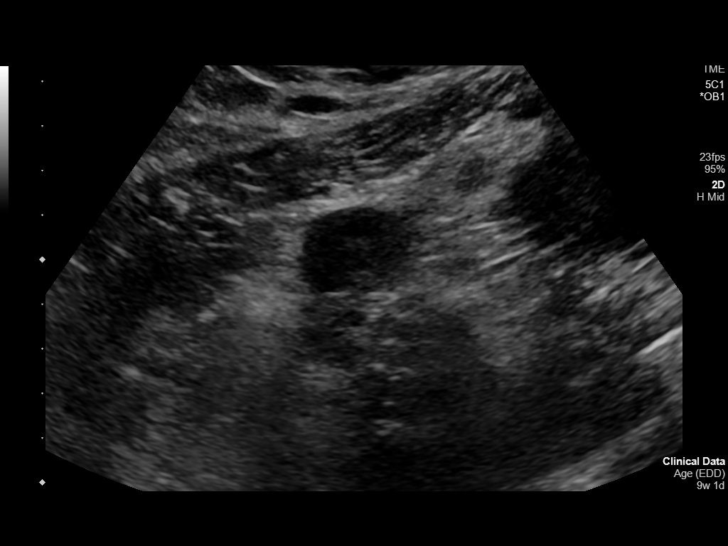
[im 41/100]
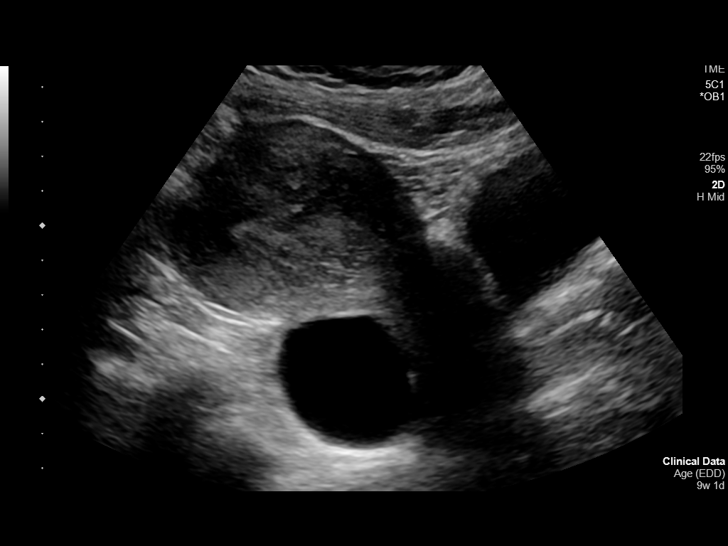
[im 52/100]
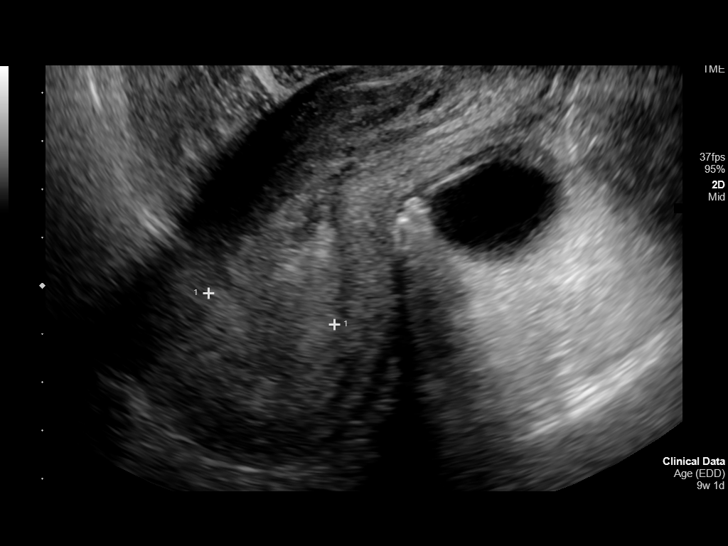
[im 59/100]
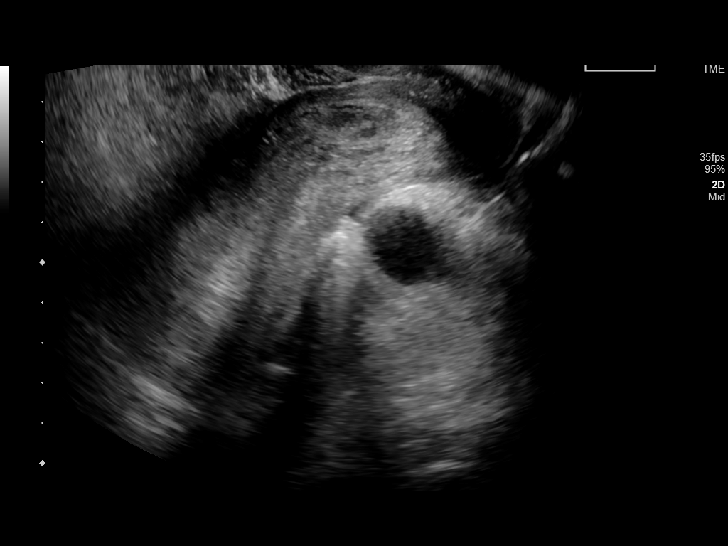
[im 67/100]
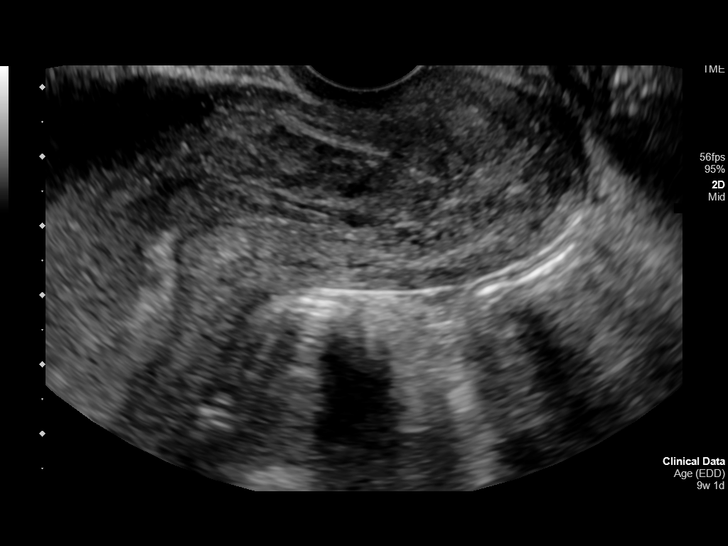
[im 74/100]
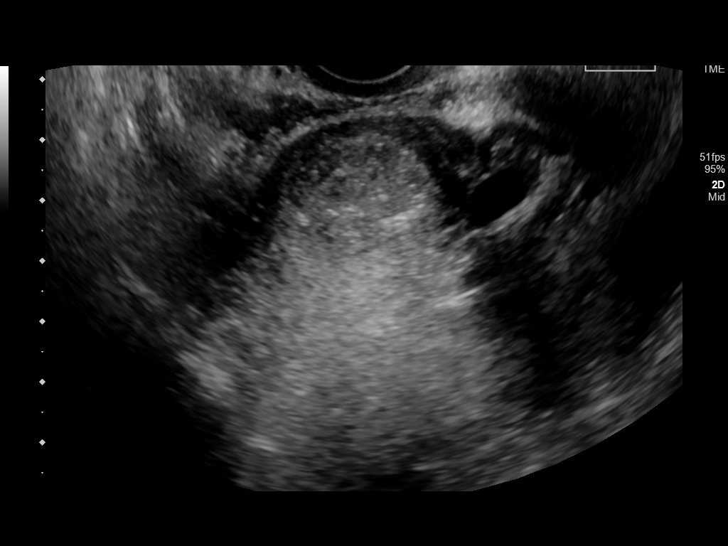
[im 81/100]
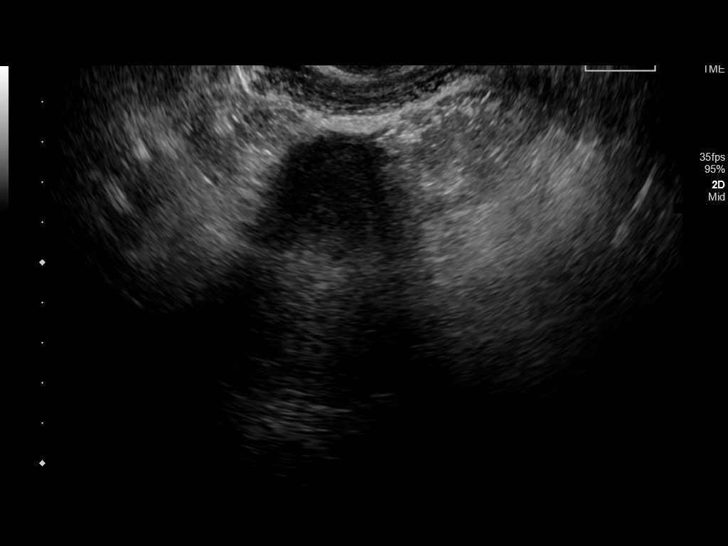
[im 89/100]
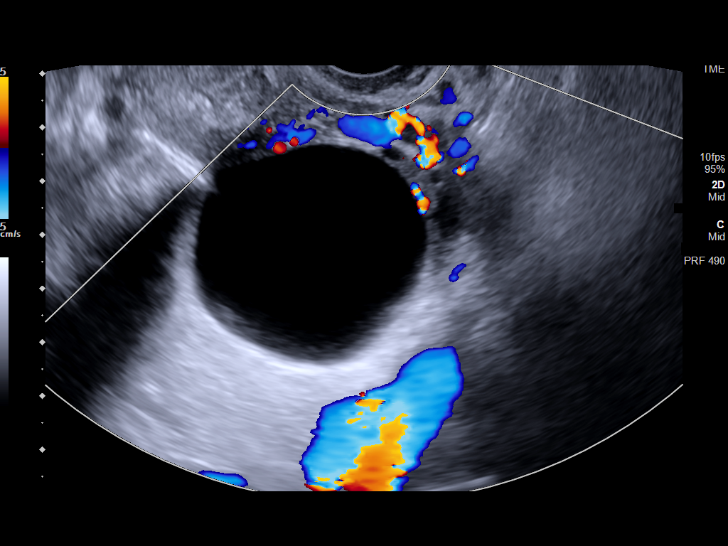
[im 96/100]
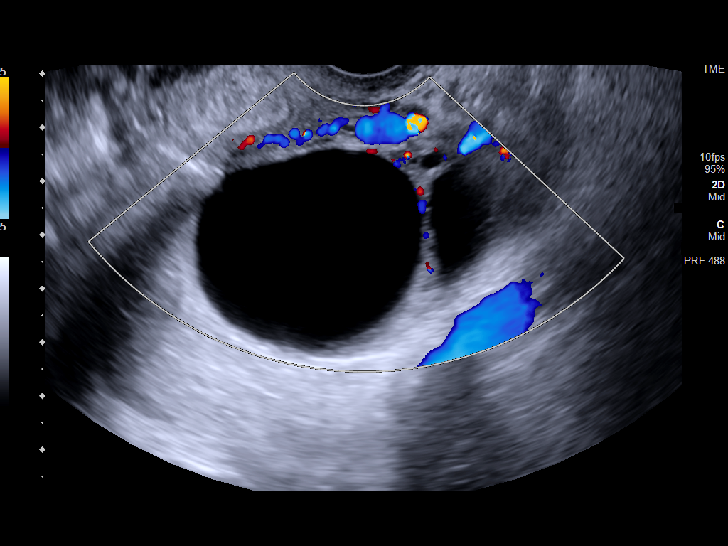

[13 of 28 positions shown; findings below may reference images not displayed]

FINDINGS: The uterus is anteverted.

The endometrium is thickened and heterogeneous measuring 2.7 cm
thickness. No intrauterine pregnancy identified. Large heterogeneous
content within the endometrium extending into the cervical canal
most likely represent blood product/clot. Retained products of
conception is not excluded.

Please note as there is no prior documented intrauterine pregnancy,
findings consistent with pregnancy of unknown location in this
patient with positive HCG levels. Differential diagnosis include
recent abortion, early pregnancy, or an ectopic pregnancy. Clinical
correlation and follow-up with serial HCG levels and ultrasound
recommended.

Maternal uterus/adnexae: The right ovary is unremarkable and
measures 3.1 x 1.6 x 2.6 cm. There is a 4.3 x 4.1 x 4.2 cm simple
cyst in the left ovary.
IMPRESSION: 1. No intrauterine pregnancy.  See above discussion.
2. Large amount of blood product/clot versus retained products of
conception. Clinical correlation is recommended.
3. Left ovarian cyst.

## 2020-02-19 ENCOUNTER — Ambulatory Visit: Payer: Medicaid Other | Attending: Internal Medicine

## 2020-02-19 DIAGNOSIS — Z20822 Contact with and (suspected) exposure to covid-19: Secondary | ICD-10-CM

## 2020-02-20 LAB — NOVEL CORONAVIRUS, NAA: SARS-CoV-2, NAA: NOT DETECTED

## 2020-02-29 ENCOUNTER — Ambulatory Visit: Payer: Medicaid Other | Attending: Internal Medicine

## 2020-02-29 DIAGNOSIS — Z20822 Contact with and (suspected) exposure to covid-19: Secondary | ICD-10-CM

## 2020-03-01 LAB — NOVEL CORONAVIRUS, NAA: SARS-CoV-2, NAA: NOT DETECTED

## 2020-03-14 ENCOUNTER — Encounter: Payer: Self-pay | Admitting: Emergency Medicine

## 2020-03-14 ENCOUNTER — Emergency Department
Admission: EM | Admit: 2020-03-14 | Discharge: 2020-03-14 | Disposition: A | Discharge status: Home / Self Care | Payer: Medicaid Other | Attending: Emergency Medicine | Admitting: Emergency Medicine

## 2020-03-14 ENCOUNTER — Other Ambulatory Visit: Payer: Self-pay

## 2020-03-14 ENCOUNTER — Emergency Department: Payer: Medicaid Other

## 2020-03-14 DIAGNOSIS — S61259A Open bite of unspecified finger without damage to nail, initial encounter: Secondary | ICD-10-CM

## 2020-03-14 DIAGNOSIS — Z87891 Personal history of nicotine dependence: Secondary | ICD-10-CM | POA: Insufficient documentation

## 2020-03-14 DIAGNOSIS — Y9389 Activity, other specified: Secondary | ICD-10-CM | POA: Insufficient documentation

## 2020-03-14 DIAGNOSIS — Y929 Unspecified place or not applicable: Secondary | ICD-10-CM | POA: Insufficient documentation

## 2020-03-14 DIAGNOSIS — S61252A Open bite of right middle finger without damage to nail, initial encounter: Secondary | ICD-10-CM | POA: Diagnosis not present

## 2020-03-14 DIAGNOSIS — Y999 Unspecified external cause status: Secondary | ICD-10-CM | POA: Insufficient documentation

## 2020-03-14 DIAGNOSIS — Z79899 Other long term (current) drug therapy: Secondary | ICD-10-CM | POA: Diagnosis not present

## 2020-03-14 DIAGNOSIS — Z23 Encounter for immunization: Secondary | ICD-10-CM | POA: Insufficient documentation

## 2020-03-14 MED ORDER — TETANUS-DIPHTH-ACELL PERTUSSIS 5-2.5-18.5 LF-MCG/0.5 IM SUSP
0.5000 mL | Freq: Once | INTRAMUSCULAR | Status: AC
  Administered 2020-03-14: 11:00:00 0.5 mL via INTRAMUSCULAR
  Filled 2020-03-14: qty 0.5

## 2020-03-14 MED ORDER — AMOXICILLIN-POT CLAVULANATE 875-125 MG PO TABS: 1.0000 | ORAL_TABLET | Freq: Two times a day (BID) | ORAL | 0 refills | Status: AC
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
## 2020-10-09 ENCOUNTER — Other Ambulatory Visit: Payer: Self-pay

## 2020-10-09 ENCOUNTER — Ambulatory Visit: Payer: Medicaid Other | Admitting: Physician Assistant

## 2020-10-09 ENCOUNTER — Encounter: Payer: Self-pay | Admitting: Physician Assistant

## 2020-10-09 DIAGNOSIS — Z113 Encounter for screening for infections with a predominantly sexual mode of transmission: Secondary | ICD-10-CM

## 2020-10-09 NOTE — Progress Notes (Signed)
  [facility] Department STI clinic/screening visit  Subjective:  Julie [name] is a 24 y.o. female being seen today for an STI screening visit. The patient reports they do not have symptoms.  Patient reports that they do not desire a pregnancy in the next year.   They reported they are not interested in discussing contraception today.  Patient's last menstrual period was 10/01/2020.   Patient has the following medical conditions:   Patient Active Problem List   Diagnosis Date Noted  . Pleural effusion in other conditions classified elsewhere 01/31/2017  . Pulmonary edema cardiac cause (HCC) 01/31/2017  . Post-operative state 01/28/2017  . Gestational diabetes 01/25/2017  . Supervision of high risk pregnancy in third trimester 12/24/2016  . Insulin dependent gestational diabetes mellitus (GDM), antepartum 12/11/2016  . Gestational diabetes mellitus 12/07/2016  . First trimester screening 08/06/2016  . Migraine with aura, without mention of intractable migraine without mention of status migrainosus 07/17/2013    Chief Complaint  Patient presents with  . SEXUALLY TRANSMITTED DISEASE    screening    HPI  Patient reports that she has a partner that told her he was having symptoms.  States that she is not having any symptoms but would like to have a screening done.  Reports that her last HIV test was 6 months ago and her last pap was 2 months ago.   See flowsheet for further details and programmatic requirements.    The following portions of the patient's history were reviewed and updated as appropriate: allergies, current medications, past medical history, past social history, past surgical history and problem list.  Objective:  There were no vitals filed for this visit.  Physical Exam Constitutional:      General: She is not in acute distress.    Appearance: Normal appearance.  HENT:     Head: Normocephalic and atraumatic.     Comments: No nits,lice, or hair  loss. No cervical, supraclavicular or axillary adenopathy.    Mouth/Throat:     Mouth: Mucous membranes are moist.     Pharynx: Oropharynx is clear. No oropharyngeal exudate or posterior oropharyngeal erythema.  Eyes:     Conjunctiva/sclera: Conjunctivae normal.  Pulmonary:     Effort: Pulmonary effort is normal.  Musculoskeletal:     Cervical back: Neck supple. No tenderness.  Skin:    General: Skin is warm and dry.     Findings: No bruising, erythema, lesion or rash.  Neurological:     Mental Status: She is alert and oriented to person, place, and time.  Psychiatric:        Mood and Affect: Mood normal.        Behavior: Behavior normal.        Thought Content: Thought content normal.        Judgment: Judgment normal.      Assessment and Plan:  Julie [name] is a 24 y.o. female presenting to the [facility] Department for STI screening  1. Screening for STD (sexually transmitted disease) Patient into clinic without symptoms. Patient declines blood work and other screening today.  Requests screening for GC and Chlamydia from her throat only.  Rec condoms with all sex. Await test results.  Counseled that RN will call if needs to RTC for treatment once results are back. - Chlamydia/Gonorrhea [facility]     No follow-ups on file.  No future appointments.  Matt Holmes, PA

## 2020-10-16 ENCOUNTER — Telehealth: Payer: Self-pay

## 2020-10-16 NOTE — Telephone Encounter (Signed)
Call to patient, notified of positive gonorrhea from 10/09/2020. Patient scheduled for tx on 10/17/2020. Patient instructed to eat before appt. All questions answered.   Harvie Heck, RN

## 2020-10-17 ENCOUNTER — Telehealth: Payer: Self-pay

## 2020-10-17 NOTE — Telephone Encounter (Signed)
[facility] as scheduled this am in Nurse Clinic for gonorrhea treatment. Call to client and left message requesting missed appt be rescheduled. Number to call provided. Jossie Ng, RN

## 2020-10-21 ENCOUNTER — Ambulatory Visit: Payer: Medicaid Other

## 2020-10-21 ENCOUNTER — Other Ambulatory Visit: Payer: Self-pay

## 2020-10-21 DIAGNOSIS — A549 Gonococcal infection, unspecified: Secondary | ICD-10-CM

## 2020-10-21 DIAGNOSIS — Z113 Encounter for screening for infections with a predominantly sexual mode of transmission: Secondary | ICD-10-CM

## 2020-10-21 MED ORDER — CEFTRIAXONE SODIUM 500 MG IJ SOLR
500.0000 mg | Freq: Once | INTRAMUSCULAR | Status: AC
  Administered 2020-10-21: 500 mg via INTRAMUSCULAR

## 2020-12-06 ENCOUNTER — Ambulatory Visit: Payer: Medicaid Other | Admitting: Physician Assistant

## 2020-12-06 ENCOUNTER — Other Ambulatory Visit: Payer: Self-pay

## 2020-12-06 DIAGNOSIS — Z113 Encounter for screening for infections with a predominantly sexual mode of transmission: Secondary | ICD-10-CM | POA: Diagnosis not present

## 2020-12-06 DIAGNOSIS — Z202 Contact with and (suspected) exposure to infections with a predominantly sexual mode of transmission: Secondary | ICD-10-CM | POA: Diagnosis not present

## 2020-12-06 MED ORDER — AZITHROMYCIN 500 MG PO TABS
1000.0000 mg | ORAL_TABLET | Freq: Once | ORAL | Status: AC
  Administered 2020-12-06: 1000 mg via ORAL

## 2020-12-06 MED ORDER — CEFTRIAXONE SODIUM 500 MG IJ SOLR
500.0000 mg | Freq: Once | INTRAMUSCULAR | Status: AC
  Administered 2020-12-06: 500 mg via INTRAMUSCULAR

## 2020-12-06 NOTE — Progress Notes (Signed)
Pt treated with ceftriaxone and azithromycin per Sadie Haber, PA orders. Provider orders completed.

## 2020-12-08 ENCOUNTER — Encounter: Payer: Self-pay | Admitting: Physician Assistant

## 2020-12-08 NOTE — Progress Notes (Signed)
  [facility] Department STI clinic/screening visit  Subjective:  Julie [name] is a 24 y.o. female being seen today for an STI screening visit. The patient reports they do not have symptoms.  Patient reports that they do not desire a pregnancy in the next year.   They reported they are not interested in discussing contraception today.  No LMP recorded.   Patient has the following medical conditions:   Patient Active Problem List   Diagnosis Date Noted  . Pleural effusion in other conditions classified elsewhere 01/31/2017  . Pulmonary edema cardiac cause (HCC) 01/31/2017  . Post-operative state 01/28/2017  . Gestational diabetes 01/25/2017  . Supervision of high risk pregnancy in third trimester 12/24/2016  . Insulin dependent gestational diabetes mellitus (GDM), antepartum 12/11/2016  . Gestational diabetes mellitus 12/07/2016  . First trimester screening 08/06/2016  . Migraine with aura, without mention of intractable migraine without mention of status migrainosus 07/17/2013    Chief Complaint  Patient presents with  . SEXUALLY TRANSMITTED DISEASE    STD screening     HPI  Patient reports that she had a white discharge last week, but that went away.  Reports that her partner was treated for [facility] and/or Chlamydia yesterday, but she is not sure whether it was one, the other or both.  States her last HIV test was 6 months ago and last pap was in 2020.   See flowsheet for further details and programmatic requirements.    The following portions of the patient's history were reviewed and updated as appropriate: allergies, current medications, past medical history, past social history, past surgical history and problem list.  Objective:  There were no vitals filed for this visit.  Physical Exam Constitutional:      General: She is not in acute distress.    Appearance: Normal appearance.  HENT:     Head: Normocephalic and atraumatic.  Eyes:     Conjunctiva/sclera:  Conjunctivae normal.  Pulmonary:     Effort: Pulmonary effort is normal.  Skin:    General: Skin is warm and dry.     Findings: No bruising, erythema, lesion or rash.  Neurological:     Mental Status: She is alert and oriented to person, place, and time.  Psychiatric:        Mood and Affect: Mood normal.        Behavior: Behavior normal.        Thought Content: Thought content normal.        Judgment: Judgment normal.      Assessment and Plan:  [name] is a 24 y.o. female presenting to the [facility] Department for STI screening  1. Screening for STD (sexually transmitted disease) Patient into clinic without symptoms. Patient declines screening exam and blood work today.  Requests treatment only.  Rec condoms with all sex.   2. Venereal disease contact Will treat to cover for both GC and Chlamydia with Ceftriaxone 500 mg IM and Azithromycin 1 g po DOT today. No sex for 14 days and until after partner completes treatment. Call with questions or concerns and if vomits < 2 hr after taking medicine for re-treatment. - cefTRIAXone (ROCEPHIN) injection 500 mg - azithromycin (ZITHROMAX) tablet 1,000 mg     No follow-ups on file.  No future appointments.  Matt Holmes, PA

## 2020-12-12 ENCOUNTER — Ambulatory Visit: Payer: Medicaid Other

## 2021-01-31 ENCOUNTER — Emergency Department
Admission: EM | Admit: 2021-01-31 | Discharge: 2021-01-31 | Disposition: A | Discharge status: Home / Self Care | Payer: Medicaid Other | Attending: Emergency Medicine | Admitting: Emergency Medicine

## 2021-01-31 ENCOUNTER — Encounter: Payer: Self-pay | Admitting: Emergency Medicine

## 2021-01-31 ENCOUNTER — Other Ambulatory Visit: Payer: Self-pay | Admitting: Surgery

## 2021-01-31 ENCOUNTER — Other Ambulatory Visit: Payer: Self-pay

## 2021-01-31 ENCOUNTER — Emergency Department: Payer: Medicaid Other

## 2021-01-31 DIAGNOSIS — Z87891 Personal history of nicotine dependence: Secondary | ICD-10-CM | POA: Diagnosis not present

## 2021-01-31 DIAGNOSIS — N611 Abscess of the breast and nipple: Secondary | ICD-10-CM

## 2021-01-31 DIAGNOSIS — N644 Mastodynia: Secondary | ICD-10-CM | POA: Diagnosis present

## 2021-01-31 MED ORDER — CLINDAMYCIN HCL 300 MG PO CAPS: 300.0000 mg | ORAL_CAPSULE | Freq: Three times a day (TID) | ORAL | 0 refills | Status: AC

## 2021-01-31 MED ORDER — CLINDAMYCIN HCL 150 MG PO CAPS
300.0000 mg | ORAL_CAPSULE | Freq: Once | ORAL | Status: AC
  Administered 2021-01-31: 300 mg via ORAL
  Filled 2021-01-31: qty 2

## 2021-01-31 MED ORDER — IBUPROFEN 800 MG PO TABS
800.0000 mg | ORAL_TABLET | Freq: Once | ORAL | Status: AC
  Administered 2021-01-31: 800 mg via ORAL
  Filled 2021-01-31: qty 1

## 2021-01-31 NOTE — ED Notes (Signed)
Patient back from [place], resting comfortably in bed with eyes closed at this time.

## 2021-01-31 NOTE — ED Provider Notes (Signed)
[facility] Emergency Department Provider Note  ____________________________________________  Time seen: Approximately 5:17 AM  I have reviewed the triage vital signs and the nursing notes.   HISTORY  Chief Complaint Breast Pain   HPI Julie [name] is a 25 y.o. female who presents for evaluation of left breast pain.  Patient reports that she started having sharp severe pain located on her left breast about 2 days ago.  The pain has been constant.  She reports that she feels a lump where the pain originates from.  No redness, no breast drainage, no fever or chills, no chest pain or shortness of breath.  No recent childbirth, she is not breast-feeding, no prior history of breast abscess although patient has had skin abscesses in the past.  No family history of breast or ovarian cancer.  No trauma to the breast.   Past Medical History:  Diagnosis Date  . Gestational diabetes 10/06/2016  . Headache(784.0) 10/06/2004  . History of C-section 01/28/2017    Patient Active Problem List   Diagnosis Date Noted  . Pleural effusion in other conditions classified elsewhere 01/31/2017  . Pulmonary edema cardiac cause (HCC) 01/31/2017  . Post-operative state 01/28/2017  . Gestational diabetes 01/25/2017  . Supervision of high risk pregnancy in third trimester 12/24/2016  . Insulin dependent gestational diabetes mellitus (GDM), antepartum 12/11/2016  . Gestational diabetes mellitus 12/07/2016  . First trimester screening 08/06/2016  . Migraine with aura, without mention of intractable migraine without mention of status migrainosus 07/17/2013    Past Surgical History:  Procedure Laterality Date  . CESAREAN SECTION N/A 01/28/2017   Procedure: CESAREAN SECTION;  Surgeon: Suzy Bouchard, MD;  Location: [facility] ORS;  Service: Obstetrics;  Laterality: N/A;  . TONSILLECTOMY Bilateral 10/06/2016    Prior to Admission medications   Medication Sig Start Date End Date  Taking? Authorizing Provider  clindamycin (CLEOCIN) 300 MG capsule Take 1 capsule (300 mg total) by mouth 3 (three) times daily for 7 days. 01/31/21 02/07/21 Yes Elane Peabody, [place], [place]  levothyroxine (SYNTHROID, LEVOTHROID) 25 MCG tablet Take 1 tablet (25 mcg total) by mouth daily before breakfast. 02/01/17   Katha Hamming, MD    Allergies Patient has no known allergies.  Family History  Problem Relation Age of Onset  . Anxiety disorder Mother   . Depression Mother   . Diabetes Mother   . Hyperlipidemia Mother   . Hypertension Mother   . Migraines Maternal Grandmother   . Anxiety disorder Maternal Grandmother   . Depression Maternal Grandmother   . Migraines Maternal Aunt   . Migraines Maternal Uncle     Social History Social History   Tobacco Use  . Smoking status: Former Smoker    Packs/day: 0.25    Types: Cigarettes  . Smokeless tobacco: Never Used  . Tobacco comment: quit since pregnancy  Vaping Use  . Vaping Use: Never used  Substance Use Topics  . Alcohol use: No  . Drug use: Yes    Frequency: 2.0 times per week    Types: Marijuana, Other-see comments    Comment: last time used was today    Review of Systems  Constitutional: Negative for fever. Eyes: Negative for visual changes. ENT: Negative for sore throat. Neck: No neck pain  Breast: +pain Cardiovascular: Negative for chest pain. Respiratory: Negative for shortness of breath. Gastrointestinal: Negative for abdominal pain, vomiting or diarrhea. Genitourinary: Negative for dysuria. Musculoskeletal: Negative for back pain. Skin: Negative for rash. Neurological: Negative for headaches, weakness  or numbness. Psych: No SI or HI  ____________________________________________   PHYSICAL EXAM:  VITAL SIGNS: ED Triage Vitals  Enc Vitals Group     BP 01/31/21 0251 121/76     Pulse Rate 01/31/21 0251 64     Resp 01/31/21 0251 20     Temp 01/31/21 0251 98.3 F (36.8 C)     Temp Source 01/31/21 0251  Oral     SpO2 01/31/21 0251 96 %     Weight 01/31/21 0247 278 lb (126.1 kg)     Height 01/31/21 0247 5\' 5"  (1.651 m)     Head Circumference --      Peak Flow --      Pain Score 01/31/21 0247 8     Pain Loc --      Pain Edu? --      Excl. in GC? --     Constitutional: Alert and oriented. Well appearing and in no apparent distress. HEENT:      Head: Normocephalic and atraumatic.         Eyes: Conjunctivae are normal. Sclera is non-icteric.       Mouth/Throat: Mucous membranes are moist.       Neck: Supple with no signs of meningismus. Cardiovascular: Regular rate and rhythm. No murmurs, gallops, or rubs. Respiratory: Normal respiratory effort. Lungs are clear to auscultation bilaterally.  Breast: Irregular breast tissue with several small mobile nodules. One of the nodules located below the nipple area was tender to palpation and the source of patient's complaint. It felt similar to several other nodules palpable throughout the breast. No overlying skin changes of erythema or warmth. No chest deformity or asymmetry. Normal contours. Patient has no axillary adenopathy. No nipple discharge.  Musculoskeletal: No edema, cyanosis, or erythema of extremities. Neurologic: Normal speech and language. Face is symmetric. Moving all extremities. No gross focal neurologic deficits are appreciated. Skin: Skin is warm, dry and intact. No rash noted. Psychiatric: Mood and affect are normal. Speech and behavior are normal.  ____________________________________________   LABS (all labs ordered are listed, but only abnormal results are displayed)  Labs Reviewed - No data to display ____________________________________________  EKG  none  ____________________________________________  RADIOLOGY  03/31/21 Breast: 72mm abscess ____________________________________________   PROCEDURES  Procedure(s) performed: None Procedures Critical Care performed:   None ____________________________________________   INITIAL IMPRESSION / ASSESSMENT AND PLAN / ED COURSE  25 y.o. female who presents for evaluation of left breast pain.  Breast exam showing several small mobile nodules throughout the breast tissue.  One of the nodules located under the left nipple is the source of patient's pain.  It does not feel any different than the remaining nodules palpable.  There is no axillary lymphadenopathy, no nipple discharge, no overlying skin erythema or warmth.  ddx fibrocystic breast tissue, abscess, malignancy  Will get 21 to rule out abscess and if negative will refer to outpatient mammogram.  No personal or family history of breast or ovarian cancer.  Old medical records reviewed with no prior mammograms.  _________________________ 7:08 AM on 01/31/2021 -----------------------------------------  Discussed with radiology about the read of the breast ultrasound which is positive for a 9 mm abscess.  Discussed with Dr. 03/31/2021 from surgery who will coordinate an outpatient appointment with IR for drainage.  He agrees with clindamycin as the antibiotic of choice.  Patient received the first dose here.  No systemic symptoms or signs of sepsis at this time.  Discussed return precautions with patient for signs of worsening  infection or systemic symptoms.   _____________________________________________ Please note:  Patient was evaluated in Emergency Department today for the symptoms described in the history of present illness. Patient was evaluated in the context of the global COVID-19 pandemic, which necessitated consideration that the patient might be at risk for infection with the SARS-CoV-2 virus that causes COVID-19. Institutional protocols and algorithms that pertain to the evaluation of patients at risk for COVID-19 are in a state of rapid change based on information released by regulatory bodies including the CDC and federal and state organizations. These  policies and algorithms were followed during the patient's care in the ED.  Some ED evaluations and interventions may be delayed as a result of limited staffing during the pandemic.   [place] Controlled Substance Database was reviewed by me. ____________________________________________   FINAL CLINICAL IMPRESSION(S) / ED DIAGNOSES   Final diagnoses:  Breast pain  Breast abscess      NEW MEDICATIONS STARTED DURING THIS VISIT:  ED Discharge Orders         Ordered    clindamycin (CLEOCIN) 300 MG capsule  3 times daily        01/31/21 0705           Note:  This document was prepared using Dragon voice recognition software and may include unintentional dictation errors.    Don Perking, [place], MD 01/31/21 [phone]

## 2021-01-31 NOTE — Discharge Instructions (Addendum)
Your ultrasound is consistent with a breast abscess.  Surgery's office will contact you later today to have the appointment scheduled to drain the abscess.  In the meantime make sure to take the antibiotics as prescribed 3 times a day.  Return to the emergency room for new or worsening pain, redness of the skin, fever, chills, nausea or vomiting.

## 2021-01-31 NOTE — ED Notes (Signed)
ED Provider at bedside. This RN chaperoned breast exam.

## 2021-01-31 NOTE — ED Triage Notes (Signed)
Patient ambulatory to triage with steady gait, without difficulty or distress noted; pt reports noting painful knot to left breast

## 2021-01-31 NOTE — ED Notes (Signed)
Pt c/o pain and tenderness in L breast x 2 days. Reports feeling a "bump" after pain started. Denies fever, drainage; no redness at the area.

## 2021-01-31 NOTE — ED Notes (Signed)
Patient transported to US at this time.  

## 2021-02-03 ENCOUNTER — Other Ambulatory Visit: Payer: Self-pay | Admitting: Surgery

## 2021-02-03 ENCOUNTER — Ambulatory Visit: Payer: Medicaid Other

## 2021-02-03 DIAGNOSIS — N6320 Unspecified lump in the left breast, unspecified quadrant: Secondary | ICD-10-CM

## 2021-03-06 ENCOUNTER — Ambulatory Visit (LOCAL_COMMUNITY_HEALTH_CENTER): Payer: Medicaid Other

## 2021-03-06 ENCOUNTER — Other Ambulatory Visit: Payer: Self-pay

## 2021-03-06 VITALS — BP 136/75 | Ht 64.0 in | Wt 286.0 lb

## 2021-03-06 DIAGNOSIS — Z3201 Encounter for pregnancy test, result positive: Secondary | ICD-10-CM

## 2021-03-06 LAB — PREGNANCY, URINE: Preg Test, Ur: POSITIVE — AB

## 2021-03-06 MED ORDER — PRENATAL 27-0.8 MG PO TABS: 1.0000 | ORAL_TABLET | Freq: Every day | ORAL | 0 refills | Status: AC

## 2021-03-06 NOTE — Progress Notes (Signed)
UPT positive. Unsure of plans to continue preg. Recalls problems with past pregnancies and unsure what to do. Pt is teary. PHQ 10. Declines to meet with provider/counselor today. Admits she has support with family/friends. Denies thoughts of hurting self. Contact cards for Jarvis Morgan and Hazeline Junker, LCSW given.  Has appt with [facility] on 03/11/2021. RN advised pt to keep this appt and establish prenatal care ASAP. Pt in agreement. Jerel Shepherd, RN

## 2021-03-07 ENCOUNTER — Encounter: Payer: Self-pay | Admitting: Emergency Medicine

## 2021-03-07 ENCOUNTER — Emergency Department: Payer: Medicaid Other

## 2021-03-07 ENCOUNTER — Emergency Department
Admission: EM | Admit: 2021-03-07 | Discharge: 2021-03-07 | Disposition: A | Discharge status: Home / Self Care | Payer: Medicaid Other | Attending: Emergency Medicine | Admitting: Emergency Medicine

## 2021-03-07 DIAGNOSIS — Z87891 Personal history of nicotine dependence: Secondary | ICD-10-CM | POA: Diagnosis not present

## 2021-03-07 DIAGNOSIS — O209 Hemorrhage in early pregnancy, unspecified: Secondary | ICD-10-CM | POA: Diagnosis present

## 2021-03-07 DIAGNOSIS — R1013 Epigastric pain: Secondary | ICD-10-CM | POA: Diagnosis not present

## 2021-03-07 DIAGNOSIS — Z3A08 8 weeks gestation of pregnancy: Secondary | ICD-10-CM | POA: Insufficient documentation

## 2021-03-07 DIAGNOSIS — N939 Abnormal uterine and vaginal bleeding, unspecified: Secondary | ICD-10-CM

## 2021-03-07 LAB — URINALYSIS, COMPLETE (UACMP) WITH MICROSCOPIC
Bacteria, UA: NONE SEEN
RBC / HPF: >50 RBC/hpf — ABNORMAL HIGH (ref 0–5)
Specific Gravity, Urine: 1.031 — ABNORMAL HIGH (ref 1.005–1.030)
WBC, UA: NONE SEEN WBC/hpf (ref 0–5)

## 2021-03-07 LAB — CBC
HCT: 38.1 % (ref 36.0–46.0)
Hemoglobin: 13.3 g/dL (ref 12.0–15.0)
MCH: 31.1 pg (ref 26.0–34.0)
MCHC: 34.9 g/dL (ref 30.0–36.0)
MCV: 89.2 fL (ref 80.0–100.0)
Platelets: 365 10*3/uL (ref 150–400)
RBC: 4.27 MIL/uL (ref 3.87–5.11)
RDW: 12 % (ref 11.5–15.5)
WBC: 11.8 10*3/uL — ABNORMAL HIGH (ref 4.0–10.5)
nRBC: 0 % (ref 0.0–0.2)

## 2021-03-07 LAB — HCG, QUANTITATIVE, PREGNANCY: hCG, Beta Chain, Quant, S: 5112 m[IU]/mL — ABNORMAL HIGH (ref <5)

## 2021-03-07 LAB — POC URINE PREG, ED: Preg Test, Ur: POSITIVE — AB

## 2021-03-07 LAB — ABO/RH: ABO/RH(D): O POS

## 2021-03-07 MED ORDER — ACETAMINOPHEN 500 MG PO TABS
1000.0000 mg | ORAL_TABLET | Freq: Once | ORAL | Status: AC
  Administered 2021-03-07: 1000 mg via ORAL
  Filled 2021-03-07: qty 2

## 2021-03-07 NOTE — Discharge Instructions (Addendum)
Your pelvic US today showed: 1. Single, irregular intrauterine gestational sac identified with estimated gestational age [redacted] weeks and 5 days by mean sac diameter. No yolk sac or embryo identified (8 weeks after stated LMP). 2. No pelvic free fluid or suspicious ovarian lesion; chronic simple appearing left ovarian or paraovarian cyst measuring 2.2 cm has decreased since 2019 (5.0 cm at that time). 3. Constellation is suspicious but not yet definitive for failed pregnancy. Recommend follow-up US in 10-14 days for definitive diagnosis. This recommendation follows SRU consensus guidelines: Diagnostic Criteria for Nonviable Pregnancy Early in the First Trimester. Malva Limes Med 2013; 620:3559-74.

## 2021-03-07 NOTE — ED Provider Notes (Signed)
[facility] Emergency Department Provider Note  ____________________________________________   Event Date/Time   First MD Initiated Contact with Patient 03/07/21 [phone]     (approximate)  I have reviewed the triage vital signs and the nursing notes.   HISTORY  Chief Complaint Vaginal Bleeding   HPI Julie [name] is a 25 y.o. female G2P1010 with a past medical history of gestational diabetes and heart failure who presents for assessment of some vaginal bleeding that she noticed this morning in the setting of being approximately 8 to [redacted] weeks pregnant by her last mental period.  She has not yet seen a physician for this pregnancy.  She states she has some lower abdominal crampiness and passed several clots of blood.  She denies any headache, earache, sore throat, fevers, chills, cough, chest pain, back pain, recent burning with urination or other abnormal vaginal bleeding or discharge.  No recent falls or injuries, rash or extremity pain weakness numbness or tingling.         Past Medical History:  Diagnosis Date  . Gestational diabetes 10/06/2016  . Headache(784.0) 10/06/2004  . History of C-section 01/28/2017    Patient Active Problem List   Diagnosis Date Noted  . Pleural effusion in other conditions classified elsewhere 01/31/2017  . Pulmonary edema cardiac cause (HCC) 01/31/2017  . Post-operative state 01/28/2017  . Gestational diabetes 01/25/2017  . Supervision of high risk pregnancy in third trimester 12/24/2016  . Insulin dependent gestational diabetes mellitus (GDM), antepartum 12/11/2016  . Gestational diabetes mellitus 12/07/2016  . First trimester screening 08/06/2016  . Migraine with aura, without mention of intractable migraine without mention of status migrainosus 07/17/2013    Past Surgical History:  Procedure Laterality Date  . CESAREAN SECTION N/A 01/28/2017   Procedure: CESAREAN SECTION;  Surgeon: Suzy Bouchard, MD;   Location: [facility] ORS;  Service: Obstetrics;  Laterality: N/A;  . TONSILLECTOMY Bilateral 10/06/2016    Prior to Admission medications   Medication Sig Start Date End Date Taking? Authorizing Provider  levothyroxine (SYNTHROID, LEVOTHROID) 25 MCG tablet Take 1 tablet (25 mcg total) by mouth daily before breakfast. Patient not taking: Reported on 03/06/2021 02/01/17   Katha Hamming, MD  Prenatal Vit-Fe Fumarate-FA (MULTIVITAMIN-PRENATAL) 27-0.8 MG TABS tablet Take 1 tablet by mouth daily at 12 noon. 03/06/21 06/14/21  Federico Flake, MD    Allergies Patient has no known allergies.  Family History  Problem Relation Age of Onset  . Anxiety disorder Mother   . Depression Mother   . Diabetes Mother   . Hyperlipidemia Mother   . Hypertension Mother   . Migraines Maternal Grandmother   . Anxiety disorder Maternal Grandmother   . Depression Maternal Grandmother   . Migraines Maternal Aunt   . Migraines Maternal Uncle     Social History Social History   Tobacco Use  . Smoking status: Former Smoker    Packs/day: 0.25    Types: Cigarettes  . Smokeless tobacco: Never Used  . Tobacco comment: quit since pregnancy  Vaping Use  . Vaping Use: Never used  Substance Use Topics  . Alcohol use: Not Currently    Comment: last use- one week ago -pint of "Hennessy"  . Drug use: Yes    Frequency: 2.0 times per week    Types: Marijuana, Other-see comments    Comment: last use- one month ago    Review of Systems  Review of Systems  Constitutional: Negative for chills and fever.  HENT: Negative for sore throat.  Eyes: Negative for pain.  Respiratory: Negative for cough and stridor.   Cardiovascular: Negative for chest pain.  Gastrointestinal: Positive for abdominal pain and diarrhea. Negative for vomiting.  Genitourinary: Negative for dysuria.  Musculoskeletal: Negative for myalgias.  Skin: Negative for rash.  Neurological: Negative for seizures, loss of consciousness and  headaches.  Psychiatric/Behavioral: Negative for suicidal ideas.  All other systems reviewed and are negative.     ____________________________________________   PHYSICAL EXAM:  VITAL SIGNS: ED Triage Vitals [03/07/21 0740]  Enc Vitals Group     BP (!) 144/64     Pulse Rate 85     Resp 20     Temp 98.5 F (36.9 C)     Temp Source Oral     SpO2 97 %     Weight 283 lb (128.4 kg)     Height 5\' 4"  (1.626 m)     Head Circumference      Peak Flow      Pain Score 8     Pain Loc      Pain Edu?      Excl. in GC?    Vitals:   03/07/21 0740 03/07/21 0910  BP: (!) 144/64 (!) 112/57  Pulse: 85 72  Resp: 20 18  Temp: 98.5 F (36.9 C) 98.5 F (36.9 C)  SpO2: 97% 99%   Physical Exam Vitals and nursing note reviewed.  Constitutional:      General: She is not in acute distress.    Appearance: She is well-developed. She is obese.  HENT:     Head: Normocephalic and atraumatic.     Right Ear: External ear normal.     Left Ear: External ear normal.     Nose: Nose normal.  Eyes:     Conjunctiva/sclera: Conjunctivae normal.  Cardiovascular:     Rate and Rhythm: Normal rate and regular rhythm.     Heart sounds: No murmur heard.   Pulmonary:     Effort: Pulmonary effort is normal. No respiratory distress.     Breath sounds: Normal breath sounds.  Abdominal:     Palpations: Abdomen is soft.     Tenderness: There is no abdominal tenderness.  Musculoskeletal:     Cervical back: Neck supple.  Skin:    General: Skin is warm and dry.  Neurological:     Mental Status: She is alert and oriented to person, place, and time.  Psychiatric:        Mood and Affect: Mood normal.      ____________________________________________   LABS (all labs ordered are listed, but only abnormal results are displayed)  Labs Reviewed  CBC - Abnormal; Notable for the following components:      Result Value   WBC 11.8 (*)    All other components within normal limits  URINALYSIS, COMPLETE  (UACMP) WITH MICROSCOPIC - Abnormal; Notable for the following components:   Color, Urine RED (*)    APPearance HAZY (*)    Specific Gravity, Urine 1.031 (*)    Glucose, UA   (*)    Value: TEST NOT REPORTED DUE TO COLOR INTERFERENCE OF URINE PIGMENT   Hgb urine dipstick   (*)    Value: TEST NOT REPORTED DUE TO COLOR INTERFERENCE OF URINE PIGMENT   Bilirubin Urine   (*)    Value: TEST NOT REPORTED DUE TO COLOR INTERFERENCE OF URINE PIGMENT   Ketones, ur   (*)    Value: TEST NOT REPORTED DUE TO COLOR INTERFERENCE OF URINE  PIGMENT   Protein, ur   (*)    Value: TEST NOT REPORTED DUE TO COLOR INTERFERENCE OF URINE PIGMENT   Nitrite   (*)    Value: TEST NOT REPORTED DUE TO COLOR INTERFERENCE OF URINE PIGMENT   Leukocytes,Ua   (*)    Value: TEST NOT REPORTED DUE TO COLOR INTERFERENCE OF URINE PIGMENT   RBC / HPF >50 (*)    All other components within normal limits  HCG, QUANTITATIVE, PREGNANCY - Abnormal; Notable for the following components:   hCG, Beta Chain, Quant, S 5,112 (*)    All other components within normal limits  POC URINE PREG, ED - Abnormal; Notable for the following components:   Preg Test, Ur POSITIVE (*)    All other components within normal limits  ABO/RH   ____________________________________________  EKG  ____________________________________________  RADIOLOGY  ED MD interpretation: Intrauterine gestational sac estimated at 5 weeks 5 days by diameter with no yolk sac or clear embryo identified.  No other acute intra pelvic process or evidence of torsion.  Most consistent with likely nonviable pregnancy.  Official radiology report(s): US OB LESS THAN 14 WEEKS WITH OB TRANSVAGINAL  Result Date: 03/07/2021 CLINICAL DATA:  25 year old female with vaginal bleeding this morning. Estimated gestational age by LMP 8 weeks and 3 days. Quantitative beta hCG 5,112. EXAM: OBSTETRIC <14 WK [place] AND TRANSVAGINAL OB US TECHNIQUE: Both transabdominal and transvaginal ultrasound  examinations were performed for complete evaluation of the gestation as well as the maternal uterus, adnexal regions, and pelvic cul-de-sac. Transvaginal technique was performed to assess early pregnancy. COMPARISON:  None recent.  Ob ultrasound 09/25/2018. FINDINGS: Intrauterine gestational sac: Single (image 580), sac shape is irregular. Yolk sac:  Not identified Embryo:    Not identified Cardiac Activity: Not applicable Heart Rate: Not applicable bpm MSD: 8.6 mm   5 w   5 d Subchorionic hemorrhage:  None visualized. Maternal uterus/adnexae: No pelvic free fluid. Chronic left adnexal region simple appearing cyst (images 76, 84) with no definite vascular elements was also identified on the 2019 ultrasound, and smaller since that time, now 2.2 cm long axis previously 5.0 cm. Adjacent left ovary (same image) otherwise appears normal with multiple small follicles, and probable corpus luteum measuring 1.9 cm (images 96 and 97). The left ovary is 3.6 x 2.3 x 2.4 cm (volume 11 mL). Right ovary visible transabdominally and appears normal measuring 3.6 x 1.9 x 2.5 cm (volume 9 mL). IMPRESSION: 1. Single, irregular intrauterine gestational sac identified with estimated gestational age [redacted] weeks and 5 days by mean sac diameter. No yolk sac or embryo identified (8 weeks after stated LMP). 2. No pelvic free fluid or suspicious ovarian lesion; chronic simple appearing left ovarian or paraovarian cyst measuring 2.2 cm has decreased since 2019 (5.0 cm at that time). 3. Constellation is suspicious but not yet definitive for failed pregnancy. Recommend follow-up US in 10-14 days for definitive diagnosis. This recommendation follows SRU consensus guidelines: Diagnostic Criteria for Nonviable Pregnancy Early in the First Trimester. Malva Limes Med 2013; [id number]. Electronically Signed   By: Odessa Fleming M.D.   On: 03/07/2021 09:36    ____________________________________________   PROCEDURES  Procedure(s) performed (including Critical  Care):  .1-3 Lead EKG Interpretation Performed by: Gilles Chiquito, MD Authorized by: Gilles Chiquito, MD     Interpretation: normal     ECG rate assessment: normal     Rhythm: sinus rhythm     Ectopy: none  Conduction: normal       ____________________________________________   INITIAL IMPRESSION / ASSESSMENT AND PLAN / ED COURSE      Patient presents with above-stated exam for assessment of some vaginal bleeding and crampy pelvic pain in setting of being approximately 8 to [redacted] weeks pregnant by last LMP.  On arrival she is afebrile hemodynamically stable.  Exam very mild suprapubic tenderness on exam otherwise she has not peritonitis or guarding and has no CVA tenderness.  Patient is hemodynamically stable on arrival and is nonperitoneal regarding her low suspicion for large volume intraperitoneal hemorrhage at this time.  CBC shows hemoglobin of 13.3 WBC count of 11.8.  No indication for transfusion at this time.  Patient is O+ and there is no indication for RhoGam at this time.  hCG is 5112 consistent with possible 5-week pregnancy.  Ultrasound obtained shows evidence of no torsion but single irregular intrauterine gestational sac that appears nonviable.  Discussed this with the patient and likelihood that this is a nonviable pregnancy based on ultrasound findings.  Patient stated that she was already planning to have an elective abortion.  Given stable vitals with otherwise exam and hemoglobin I believe patient is safe for discharge with plan for outpatient OB follow-up.  Advised her that she will need repeat ultrasound in 10 to 14 days and she voiced understanding and agreement this plan.  Advised her on expected clinical course discussed strict return precautions including development of any worsening bleeding, headache, shortness of breath, dizziness or any other acute sick symptoms.  Patient voiced understanding and agreement this plan.  Discharged stable condition.  Strict  return precautions advised and discussed.  ____________________________________________   FINAL CLINICAL IMPRESSION(S) / ED DIAGNOSES  Final diagnoses:  Vaginal bleeding in pregnancy, first trimester    Medications  acetaminophen (TYLENOL) tablet 1,000 mg (1,000 mg Oral Given 03/07/21 0909)     ED Discharge Orders    None       Note:  This document was prepared using Dragon voice recognition software and may include unintentional dictation errors.   Gilles Chiquito, MD 03/07/21 1022

## 2021-03-07 NOTE — ED Triage Notes (Signed)
Patient to ER for c/o vaginal bleeding (heavier than spotting) since waking this am. Patient is approx 8-[redacted] weeks pregnant. +Cramping. 3rd pregnancy.

## 2021-04-15 ENCOUNTER — Other Ambulatory Visit: Payer: Self-pay

## 2021-04-15 ENCOUNTER — Emergency Department
Admission: EM | Admit: 2021-04-15 | Discharge: 2021-04-15 | Disposition: A | Discharge status: Home / Self Care | Payer: Medicaid Other | Attending: Emergency Medicine | Admitting: Emergency Medicine

## 2021-04-15 ENCOUNTER — Encounter: Payer: Self-pay | Admitting: Emergency Medicine

## 2021-04-15 DIAGNOSIS — M545 Low back pain, unspecified: Secondary | ICD-10-CM | POA: Insufficient documentation

## 2021-04-15 DIAGNOSIS — Z87891 Personal history of nicotine dependence: Secondary | ICD-10-CM | POA: Insufficient documentation

## 2021-04-15 MED ORDER — CYCLOBENZAPRINE HCL 5 MG PO TABS: 5.0000 mg | ORAL_TABLET | Freq: Three times a day (TID) | ORAL | 0 refills | Status: AC | PRN

## 2021-04-15 NOTE — ED Triage Notes (Signed)
Patient to ER for c/o lower back pain. Denies any injury. Denies any dysuria.

## 2021-04-15 NOTE — ED Provider Notes (Signed)
[facility] Emergency Department Provider Note  Time seen: 1:14 PM  I have reviewed the triage vital signs and the nursing notes.   HISTORY  Chief Complaint Back Pain   HPI Julie [name] is a 25 y.o. female with a recent miscarriage approximately 6 weeks ago who presents to the emergency department for right lower back pain.  According to the patient on Saturday she was bending over and upon standing up developed acute pain to the right lower back.  Patient states the pain is worse with movement especially bending or twisting.  Patient states approximate 6 weeks ago she did have a miscarriage but the bleeding has stopped at least 4 weeks ago and she has had no further issues.  Denies any vaginal bleeding.  No abdominal pain.  No weakness or numbness of any leg.   Past Medical History:  Diagnosis Date  . Gestational diabetes 10/06/2016  . Headache(784.0) 10/06/2004  . History of C-section 01/28/2017    Patient Active Problem List   Diagnosis Date Noted  . Pleural effusion in other conditions classified elsewhere 01/31/2017  . Pulmonary edema cardiac cause (HCC) 01/31/2017  . Post-operative state 01/28/2017  . Gestational diabetes 01/25/2017  . Supervision of high risk pregnancy in third trimester 12/24/2016  . Insulin dependent gestational diabetes mellitus (GDM), antepartum 12/11/2016  . Gestational diabetes mellitus 12/07/2016  . First trimester screening 08/06/2016  . Migraine with aura, without mention of intractable migraine without mention of status migrainosus 07/17/2013    Past Surgical History:  Procedure Laterality Date  . CESAREAN SECTION N/A 01/28/2017   Procedure: CESAREAN SECTION;  Surgeon: Suzy Bouchard, MD;  Location: [facility] ORS;  Service: Obstetrics;  Laterality: N/A;  . TONSILLECTOMY Bilateral 10/06/2016    Prior to Admission medications   Medication Sig Start Date End Date Taking? Authorizing Provider  levothyroxine (SYNTHROID,  LEVOTHROID) 25 MCG tablet Take 1 tablet (25 mcg total) by mouth daily before breakfast. Patient not taking: Reported on 03/06/2021 02/01/17   Katha Hamming, MD  Prenatal Vit-Fe Fumarate-FA (MULTIVITAMIN-PRENATAL) 27-0.8 MG TABS tablet Take 1 tablet by mouth daily at 12 noon. 03/06/21 06/14/21  Federico Flake, MD    No Known Allergies  Family History  Problem Relation Age of Onset  . Anxiety disorder Mother   . Depression Mother   . Diabetes Mother   . Hyperlipidemia Mother   . Hypertension Mother   . Migraines Maternal Grandmother   . Anxiety disorder Maternal Grandmother   . Depression Maternal Grandmother   . Migraines Maternal Aunt   . Migraines Maternal Uncle     Social History Social History   Tobacco Use  . Smoking status: Former Smoker    Packs/day: 0.25    Types: Cigarettes  . Smokeless tobacco: Never Used  . Tobacco comment: quit since pregnancy  Vaping Use  . Vaping Use: Never used  Substance Use Topics  . Alcohol use: Not Currently    Comment: last use- one week ago -pint of "Hennessy"  . Drug use: Yes    Frequency: 2.0 times per week    Types: Marijuana, Other-see comments    Comment: last use- one month ago    Review of Systems Constitutional: Negative for fever Cardiovascular: Negative for chest pain. Respiratory: Negative for shortness of breath. Gastrointestinal: Negative for abdominal pain Musculoskeletal: Right lower back pain Neurological: Negative for headache All other ROS negative  ____________________________________________   PHYSICAL EXAM:  VITAL SIGNS: ED Triage Vitals  Enc Vitals Group  BP 04/15/21 1204 129/77     Pulse Rate 04/15/21 1204 76     Resp 04/15/21 1204 20     Temp 04/15/21 1204 98.2 F (36.8 C)     Temp Source 04/15/21 1204 Oral     SpO2 04/15/21 1204 97 %     Weight 04/15/21 1205 282 lb 3 oz (128 kg)     Height 04/15/21 1205 5\' 4"  (1.626 m)     Head Circumference --      Peak Flow --      Pain  Score 04/15/21 1204 9     Pain Loc --      Pain Edu? --      Excl. in GC? --    Constitutional: Alert and oriented. Well appearing and in no distress. Eyes: Normal exam ENT      Head: Normocephalic and atraumatic.      Mouth/Throat: Mucous membranes are moist. Cardiovascular: Normal rate, regular rhythm.  Respiratory: Normal respiratory effort without tachypnea nor retractions. Breath sounds are clear  Gastrointestinal: Soft and nontender. No distention.  Musculoskeletal: Mild to moderate right lower back tenderness to palpation especially over the SI joint. Neurologic:  Normal speech and language. No gross focal neurologic deficits Skin:  Skin is warm, dry and intact.  Psychiatric: Mood and affect are normal.  ____________________________________________   INITIAL IMPRESSION / ASSESSMENT AND PLAN / ED COURSE  Pertinent labs & imaging results that were available during my care of the patient were reviewed by me and considered in my medical decision making (see chart for details).   Patient presents emergency department for right lower back pain after bending over and standing up Saturday.  States the pain has been ongoing, worse with movement especially bending or twisting.  Moderate tenderness over the patient's right SI joint.  Highly suspect musculoskeletal pain.  Benign abdominal exam.  We will discharge with Flexeril and discussed using Tylenol or ibuprofen for discomfort as well.  Provided my typical back pain return precautions.  Julie [name] was evaluated in Emergency Department on 04/15/2021 for the symptoms described in the history of present illness. She was evaluated in the context of the global COVID-19 pandemic, which necessitated consideration that the patient might be at risk for infection with the SARS-CoV-2 virus that causes COVID-19. Institutional protocols and algorithms that pertain to the evaluation of patients at risk for COVID-19 are in a state of rapid change  based on information released by regulatory bodies including the CDC and federal and state organizations. These policies and algorithms were followed during the patient's care in the ED.  ____________________________________________   FINAL CLINICAL IMPRESSION(S) / ED DIAGNOSES  Back pain   04/17/2021, MD 04/15/21 1317

## 2021-04-17 ENCOUNTER — Other Ambulatory Visit: Payer: Self-pay

## 2021-04-17 ENCOUNTER — Emergency Department: Payer: Medicaid Other

## 2021-04-17 ENCOUNTER — Emergency Department
Admission: EM | Admit: 2021-04-17 | Discharge: 2021-04-17 | Disposition: A | Discharge status: Home / Self Care | Payer: Medicaid Other | Attending: Physician Assistant | Admitting: Physician Assistant

## 2021-04-17 DIAGNOSIS — Z87891 Personal history of nicotine dependence: Secondary | ICD-10-CM | POA: Insufficient documentation

## 2021-04-17 DIAGNOSIS — M4696 Unspecified inflammatory spondylopathy, lumbar region: Secondary | ICD-10-CM | POA: Diagnosis not present

## 2021-04-17 DIAGNOSIS — M545 Low back pain, unspecified: Secondary | ICD-10-CM | POA: Diagnosis present

## 2021-04-17 DIAGNOSIS — M47816 Spondylosis without myelopathy or radiculopathy, lumbar region: Secondary | ICD-10-CM

## 2021-04-17 MED ORDER — ORPHENADRINE CITRATE 30 MG/ML IJ SOLN
60.0000 mg | Freq: Two times a day (BID) | INTRAMUSCULAR | Status: DC
  Administered 2021-04-17: 60 mg via INTRAMUSCULAR
  Filled 2021-04-17: qty 2

## 2021-04-17 MED ORDER — HYDROMORPHONE HCL 1 MG/ML IJ SOLN
1.0000 mg | Freq: Once | INTRAMUSCULAR | Status: AC
  Administered 2021-04-17: 1 mg via INTRAMUSCULAR
  Filled 2021-04-17: qty 1

## 2021-04-17 MED ORDER — ORPHENADRINE CITRATE ER 100 MG PO TB12: 100.0000 mg | ORAL_TABLET | Freq: Two times a day (BID) | ORAL | 0 refills | Status: AC

## 2021-04-17 MED ORDER — IBUPROFEN 800 MG PO TABS: 800.0000 mg | ORAL_TABLET | Freq: Three times a day (TID) | ORAL | 0 refills | Status: AC | PRN

## 2021-04-17 MED ORDER — LIDOCAINE 5 % EX PTCH
1.0000 | MEDICATED_PATCH | CUTANEOUS | Status: DC
  Administered 2021-04-17: 1 via TRANSDERMAL
  Filled 2021-04-17: qty 1

## 2021-04-17 MED ORDER — OXYCODONE-ACETAMINOPHEN 7.5-325 MG PO TABS: 1.0000 | ORAL_TABLET | Freq: Four times a day (QID) | ORAL | 0 refills | Status: AC | PRN

## 2021-04-17 NOTE — Discharge Instructions (Signed)
Follow discharge care instruction.  Contact orthopedic follow-up and tell them you are follow-up for emergency room to schedule appointment for definitive evaluation and treatment.

## 2021-04-17 NOTE — ED Notes (Signed)
Pt has been provided with discharge instructions. Pt denies any questions or concerns at this time. Pt verbalizes understanding for follow up care and d/c.  VSS.  Pt left department with all belongings.  

## 2021-04-17 NOTE — ED Triage Notes (Signed)
Pt to ED [facility] from home for right lower back pain that started 2 days ago, seen yesterday for same. Denies bowel or bladder incontinence.  No relief with meds prescribed

## 2021-04-17 NOTE — ED Provider Notes (Addendum)
[facility] Emergency Department Provider Note   ____________________________________________   Event Date/Time   First MD Initiated Contact with Patient 04/17/21 [phone]     (approximate)  I have reviewed the triage vital signs and the nursing notes.   HISTORY  Chief Complaint Back Pain    HPI Julie [name] is a 25 y.o. female patient follow-up from yesterday visit for low back pain which started 3 days ago.  Patient stated medication given did not help her back pain.  Patient onset of pain was after a flexion incident.  Patient pain increased with bending or twisting movements.  Patient denies radicular component to her back pain.  Patient denies bladder bowel dysfunction.  Patient states 5 to 6 weeks ago she had a miscarriage but has no recurrent bleeding is for least a month.  Rates her pain as a 10/10.  Described pain as "achy".  Patient no relief with Flexeril and Tylenol.         Past Medical History:  Diagnosis Date  . Gestational diabetes 10/06/2016  . Headache(784.0) 10/06/2004  . History of C-section 01/28/2017    Patient Active Problem List   Diagnosis Date Noted  . Pleural effusion in other conditions classified elsewhere 01/31/2017  . Pulmonary edema cardiac cause (HCC) 01/31/2017  . Post-operative state 01/28/2017  . Gestational diabetes 01/25/2017  . Supervision of high risk pregnancy in third trimester 12/24/2016  . Insulin dependent gestational diabetes mellitus (GDM), antepartum 12/11/2016  . Gestational diabetes mellitus 12/07/2016  . First trimester screening 08/06/2016  . Migraine with aura, without mention of intractable migraine without mention of status migrainosus 07/17/2013    Past Surgical History:  Procedure Laterality Date  . CESAREAN SECTION N/A 01/28/2017   Procedure: CESAREAN SECTION;  Surgeon: Suzy Bouchard, MD;  Location: [facility] ORS;  Service: Obstetrics;  Laterality: N/A;  . TONSILLECTOMY Bilateral  10/06/2016    Prior to Admission medications   Medication Sig Start Date End Date Taking? Authorizing Provider  ibuprofen (ADVIL) 800 MG tablet Take 1 tablet (800 mg total) by mouth every 8 (eight) hours as needed. 04/17/21  Yes Joni Reining, PA-C  orphenadrine (NORFLEX) 100 MG tablet Take 1 tablet (100 mg total) by mouth 2 (two) times daily. 04/17/21  Yes Joni Reining, PA-C  oxyCODONE-acetaminophen (PERCOCET) 7.5-325 MG tablet Take 1 tablet by mouth every 6 (six) hours as needed for up to 3 days for severe pain. 04/17/21 04/20/21 Yes Joni Reining, PA-C  cyclobenzaprine (FLEXERIL) 5 MG tablet Take 1 tablet (5 mg total) by mouth 3 (three) times daily as needed for muscle spasms. 04/15/21   Minna Antis, MD  levothyroxine (SYNTHROID, LEVOTHROID) 25 MCG tablet Take 1 tablet (25 mcg total) by mouth daily before breakfast. Patient not taking: Reported on 03/06/2021 02/01/17   Katha Hamming, MD  Prenatal Vit-Fe Fumarate-FA (MULTIVITAMIN-PRENATAL) 27-0.8 MG TABS tablet Take 1 tablet by mouth daily at 12 noon. 03/06/21 06/14/21  Federico Flake, MD    Allergies Patient has no known allergies.  Family History  Problem Relation Age of Onset  . Anxiety disorder Mother   . Depression Mother   . Diabetes Mother   . Hyperlipidemia Mother   . Hypertension Mother   . Migraines Maternal Grandmother   . Anxiety disorder Maternal Grandmother   . Depression Maternal Grandmother   . Migraines Maternal Aunt   . Migraines Maternal Uncle     Social History Social History   Tobacco Use  . Smoking  status: Former Smoker    Packs/day: 0.25    Types: Cigarettes  . Smokeless tobacco: Never Used  . Tobacco comment: quit since pregnancy  Vaping Use  . Vaping Use: Never used  Substance Use Topics  . Alcohol use: Not Currently    Comment: last use- one week ago -pint of "Hen[place]y"  . Drug use: Not Currently    Frequency: 2.0 times per week    Types: Marijuana, Other-see comments     Comment: last use- one month ago    Review of Systems Constitutional: No fever/chills Eyes: No visual changes. ENT: No sore throat. Cardiovascular: Denies chest pain. Respiratory: Denies shortness of breath. Gastrointestinal: No abdominal pain.  No nausea, no vomiting.  No diarrhea.  No constipation. Genitourinary: Negative for dysuria. Musculoskeletal: Positive for back pain. Skin: Negative for rash. Neurological: Negative for headaches, focal weakness or numbness. Endocrine:  Gestational diabetes.   ____________________________________________   PHYSICAL EXAM:  VITAL SIGNS: ED Triage Vitals  Enc Vitals Group     BP 04/17/21 0935 122/78     Pulse Rate 04/17/21 0935 72     Resp 04/17/21 0935 20     Temp 04/17/21 0933 98.2 F (36.8 C)     Temp Source 04/17/21 0933 Oral     SpO2 04/17/21 0935 99 %     Weight 04/17/21 0933 282 lb 3 oz (128 kg)     Height 04/17/21 0933 5\' 4"  (1.626 m)     Head Circumference --      Peak Flow --      Pain Score 04/17/21 0933 10     Pain Loc --      Pain Edu? --      Excl. in [place]? --     Constitutional: Alert and oriented.  Moderate distress.  BMI is 48.44. Neck:No cervical spine tenderness to palpation. Hematological/Lymphatic/Immunilogical: No cervical lymphadenopathy. Cardiovascular: Normal rate, regular rhythm. Grossly normal heart sounds.  Good peripheral circulation. Respiratory: Normal respiratory effort.  No retractions. Lungs CTAB. Gastrointestinal: Soft and nontender. No distention. No abdominal bruits. No CVA tenderness. Genitourinary: Deferred Musculoskeletal: Body habitus limits test exam of the lumbar spine.  Decreased range of motion is all fields.  No obvious spinal deformity.  Patient is moderate guarding from the lower thoracic throughout the lumbar spine.  Patient had negative straight leg test in supine position.  No lower extremity tenderness nor edema.  No joint effusions. Neurologic:  Normal speech and language. No  gross focal neurologic deficits are appreciated. No gait instability. Skin:  Skin is warm, dry and intact. No rash noted. Psychiatric: Mood and affect are normal. Speech and behavior are normal.  ____________________________________________   LABS (all labs ordered are listed, but only abnormal results are displayed)  Labs Reviewed  URINALYSIS, COMPLETE (UACMP) WITH MICROSCOPIC   ____________________________________________  EKG   ____________________________________________  RADIOLOGY I, 04/19/21, personally viewed and evaluated these images (plain radiographs) as part of my medical decision making, as well as reviewing the written report by the radiologist.  ED MD interpretation: No acute findings on lumbar spine x-ray. Official radiology report(s): DG Lumbar Spine 2-3 Views  Result Date: 04/17/2021 CLINICAL DATA:  Right lower back pain for the past 2 days. EXAM: LUMBAR SPINE - 2-3 VIEW COMPARISON:  None. FINDINGS: Five non-rib-bearing lumbar vertebrae. Small Schmorl's nodes at the L1-2 and L2-3 levels. Mild posterior spur formation at the L4-5 level. Mild anterior spur formation at multiple levels of the lower thoracic spine. Minimal levoconvex scoliosis.  No fractures, pars defects or subluxations. IMPRESSION: Mild degenerative changes, as described above. Electronically Signed   By: Beckie Salts M.D.   On: 04/17/2021 10:41    ____________________________________________   PROCEDURES  Procedure(s) performed (including Critical Care):  Procedures   ____________________________________________   INITIAL IMPRESSION / ASSESSMENT AND PLAN / ED COURSE  As part of my medical decision making, I reviewed the following data within the electronic MEDICAL RECORD NUMBER         Patient presents with 3 days of low back pain.  Discussed x-ray findings with patient.  Patient complaint physical exam consistent with acute low back pain.  Patient given discharge care instruction  advised follow orthopedic for definitive evaluation and treatment.      ____________________________________________   FINAL CLINICAL IMPRESSION(S) / ED DIAGNOSES  Final diagnoses:  Arthritis, lumbar spine     ED Discharge Orders         Ordered    oxyCODONE-acetaminophen (PERCOCET) 7.5-325 MG tablet  Every 6 hours PRN        04/17/21 1111    orphenadrine (NORFLEX) 100 MG tablet  2 times daily        04/17/21 1111    ibuprofen (ADVIL) 800 MG tablet  Every 8 hours PRN        04/17/21 1111          *Please note:  Jacquelynne S Bowring was evaluated in Emergency Department on 04/17/2021 for the symptoms described in the history of present illness. She was evaluated in the context of the global COVID-19 pandemic, which necessitated consideration that the patient might be at risk for infection with the SARS-CoV-2 virus that causes COVID-19. Institutional protocols and algorithms that pertain to the evaluation of patients at risk for COVID-19 are in a state of rapid change based on information released by regulatory bodies including the CDC and federal and state organizations. These policies and algorithms were followed during the patient's care in the ED.  Some ED evaluations and interventions may be delayed as a result of limited staffing during and the pandemic.*   Note:  This document was prepared using Dragon voice recognition software and may include unintentional dictation errors.    Joni Reining, PA-C 04/17/21 1119    Joni Reining, PA-C 04/18/21 0809    Concha Se, MD 04/24/21 [phone]

## 2021-04-17 NOTE — ED Notes (Signed)
Pt to XRAY, denies being able to provide urine sample at this time

## 2021-06-02 ENCOUNTER — Ambulatory Visit: Payer: Medicaid Other

## 2022-02-03 IMAGING — CR DG LUMBAR SPINE 2-3V
3 series · 3 of 3 positions shown · non-contrast
Comparison: None.

CLINICAL DATA: Right lower back pain for the past 2 days.

EXAM:
LUMBAR SPINE - 2-3 VIEW

[l-spine ap]
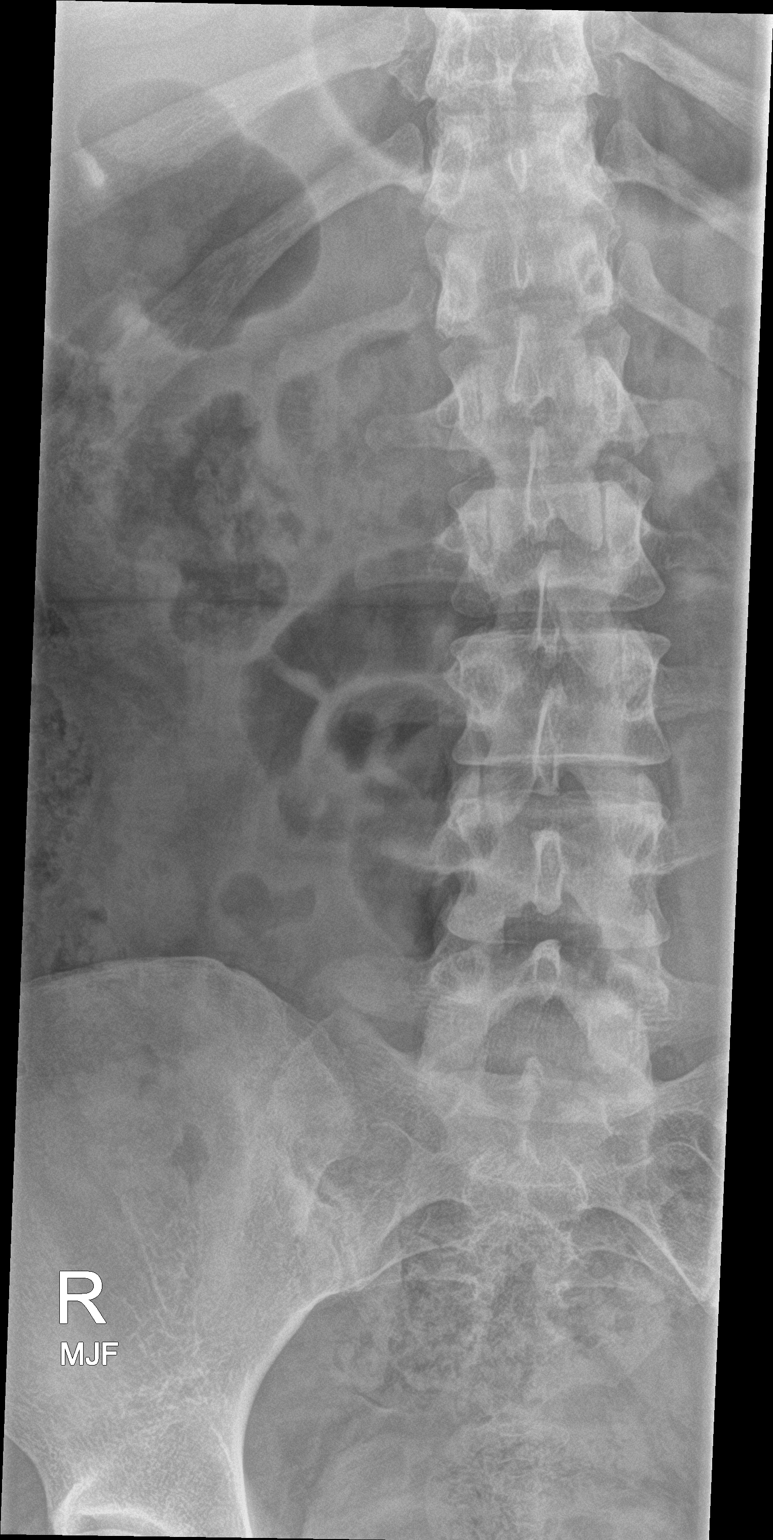

[l-spine lat]
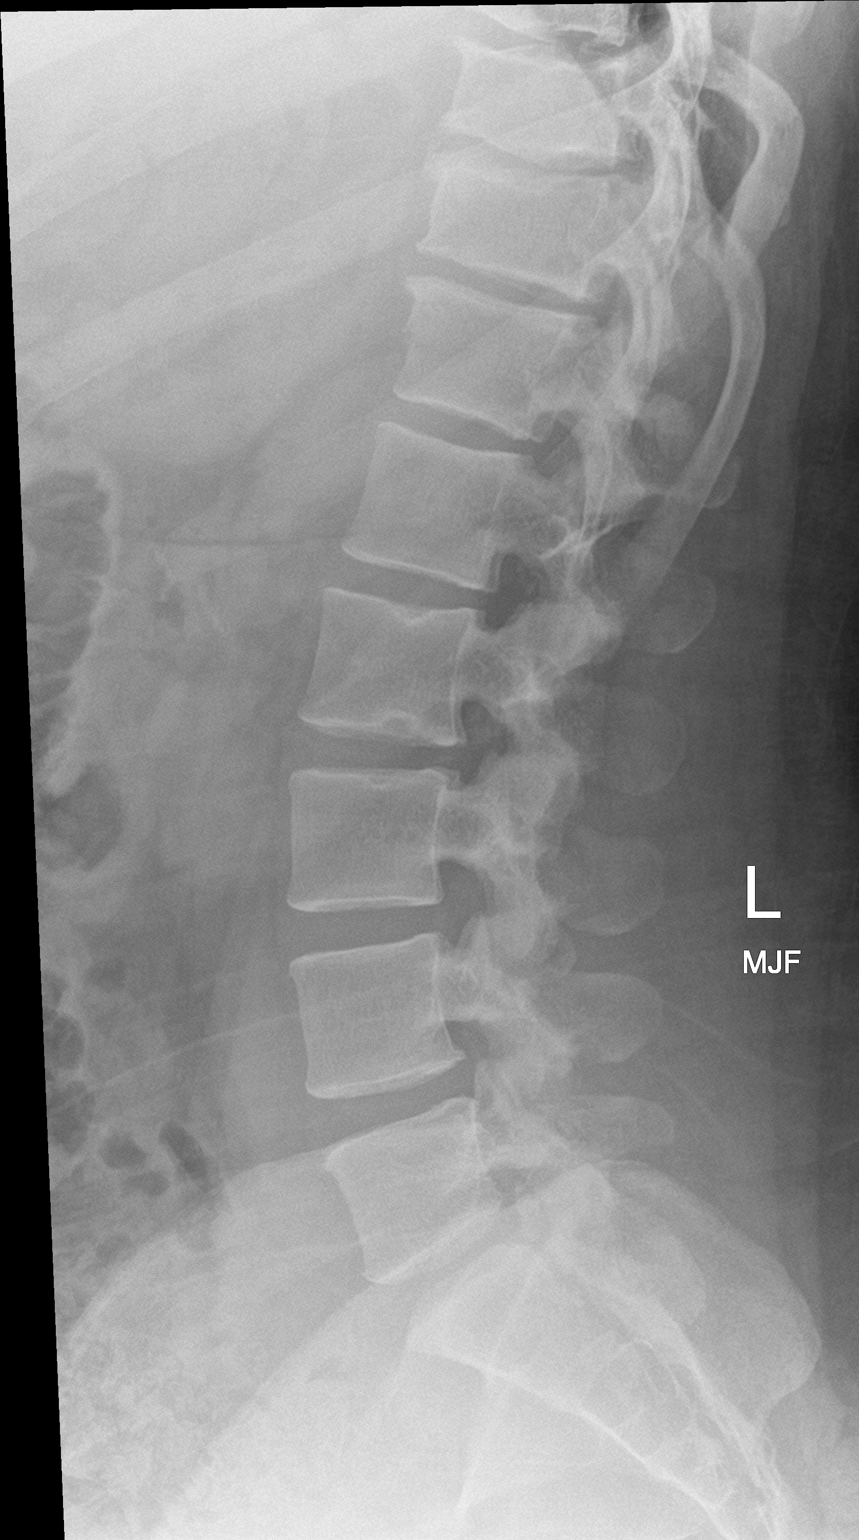

[l-spine spot]
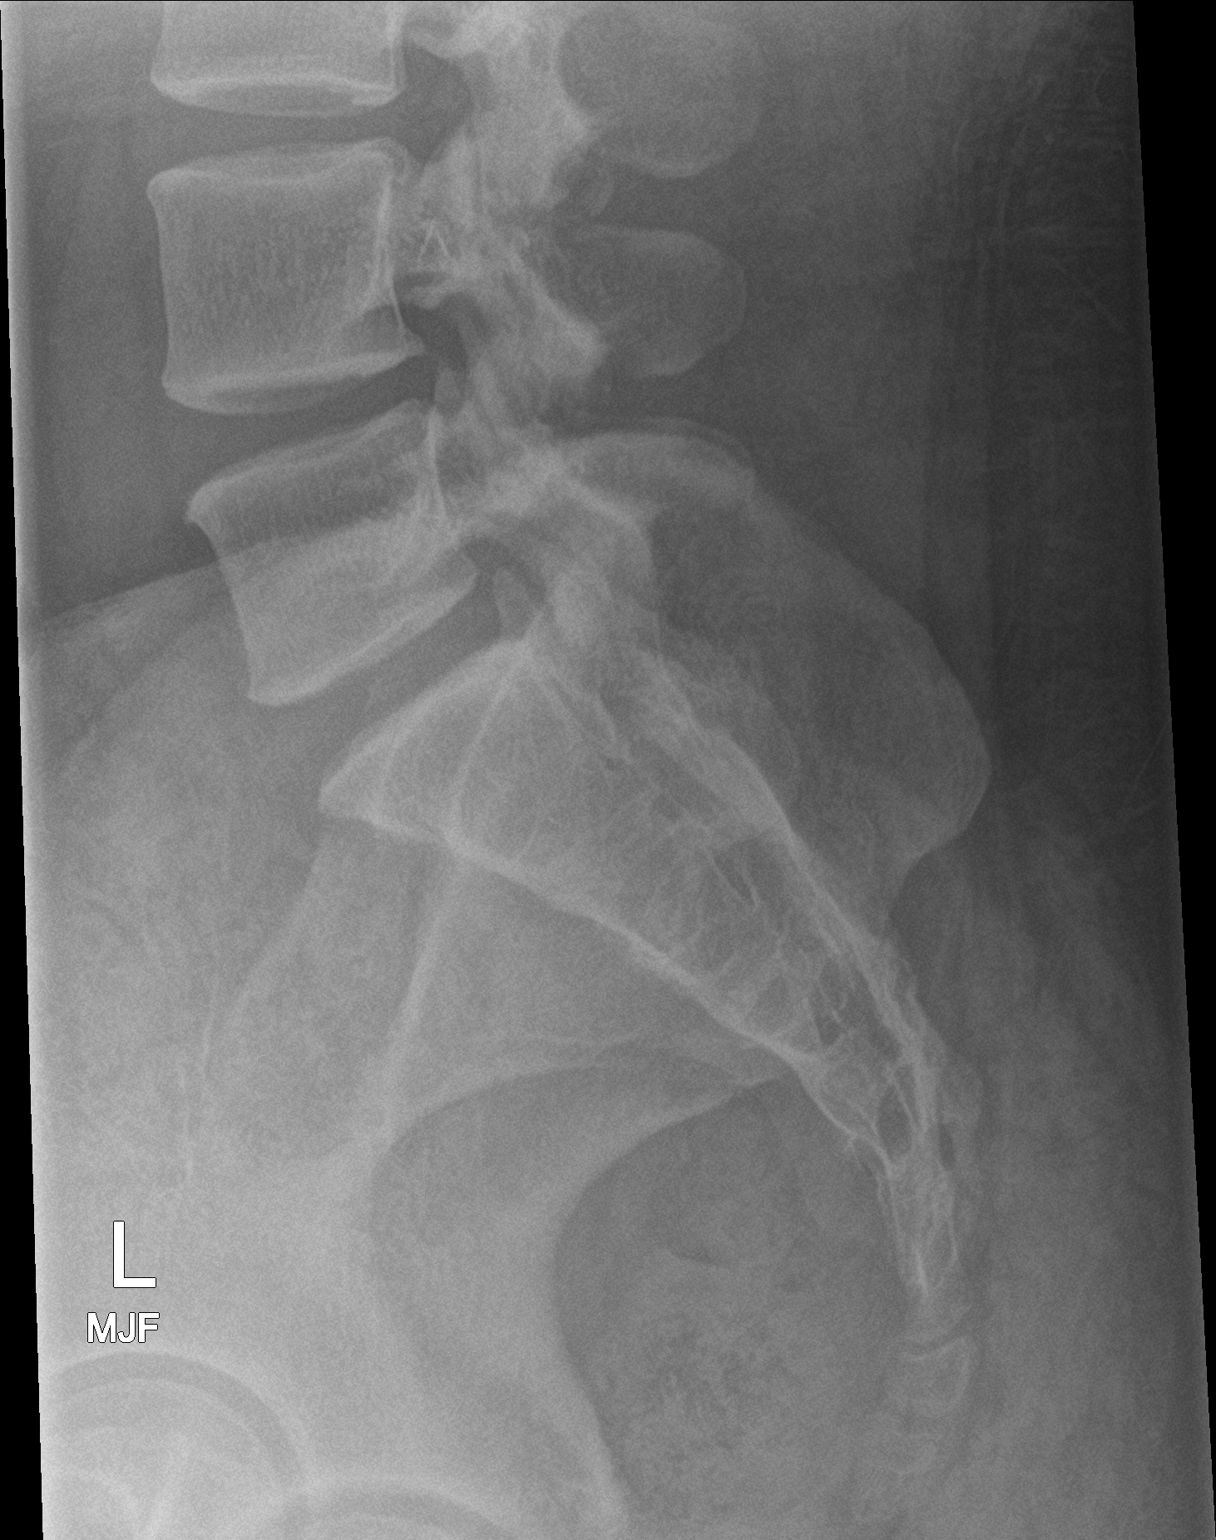

[3 of 3 positions shown; findings below may reference images not displayed]

FINDINGS: Five non-rib-bearing lumbar vertebrae. Small Schmorl's nodes at the
L1-2 and L2-3 levels. Mild posterior spur formation at the L4-5
level. Mild anterior spur formation at multiple levels of the lower
thoracic spine. Minimal levoconvex scoliosis. No fractures, pars
defects or subluxations.
IMPRESSION: Mild degenerative changes, as described above.

## 2022-05-27 ENCOUNTER — Ambulatory Visit: Payer: Medicaid Other

## 2022-09-14 ENCOUNTER — Emergency Department
Admission: EM | Admit: 2022-09-14 | Discharge: 2022-09-14 | Disposition: A | Discharge status: Home / Self Care | Payer: Medicaid Other | Attending: Emergency Medicine | Admitting: Emergency Medicine

## 2022-09-14 ENCOUNTER — Other Ambulatory Visit: Payer: Self-pay

## 2022-09-14 DIAGNOSIS — U071 COVID-19: Secondary | ICD-10-CM | POA: Insufficient documentation

## 2022-09-14 DIAGNOSIS — J069 Acute upper respiratory infection, unspecified: Secondary | ICD-10-CM

## 2022-09-14 DIAGNOSIS — B349 Viral infection, unspecified: Secondary | ICD-10-CM | POA: Diagnosis not present

## 2022-09-14 DIAGNOSIS — J029 Acute pharyngitis, unspecified: Secondary | ICD-10-CM | POA: Diagnosis present

## 2022-09-14 LAB — RESP PANEL BY RT-PCR (FLU A&B, COVID) ARPGX2
Influenza A by PCR: NEGATIVE
Influenza B by PCR: NEGATIVE
SARS Coronavirus 2 by RT PCR: POSITIVE — AB

## 2022-09-14 NOTE — ED Provider Notes (Signed)
   [facility] Provider Note    Event Date/Time   First MD Initiated Contact with Patient 09/14/22 1100     (approximate)  History   Chief Complaint: Sore Throat  HPI  Julie [name] is a 26 y.o. female with no significant past medical history presents to the emergency department for congestion body aches subjective fever.  According to the patient for the past 2 days she has had body aches subjective fever/chills sore throat congestion and slight cough.  Patient is concerned she could have COVID.  Patient states she is supposed to work today so she came to the emergency department to get tested.  Physical Exam   Triage Vital Signs: ED Triage Vitals  Enc Vitals Group     BP 09/14/22 1005 128/79     Pulse Rate 09/14/22 1005 100     Resp 09/14/22 1005 18     Temp 09/14/22 1005 97.9 F (36.6 C)     Temp src --      SpO2 09/14/22 1005 100 %     Weight 09/14/22 1052 282 lb 3 oz (128 kg)     Height 09/14/22 1052 5\' 4"  (1.626 m)     Head Circumference --      Peak Flow --      Pain Score 09/14/22 1003 6     Pain Loc --      Pain Edu? --      Excl. in [place]? --     Most recent vital signs: Vitals:   09/14/22 1005  BP: 128/79  Pulse: 100  Resp: 18  Temp: 97.9 F (36.6 C)  SpO2: 100%    General: Awake, no distress.  CV:  Good peripheral perfusion.  Regular rate and rhythm  Resp:  Normal effort.  Equal breath sounds bilaterally.  Abd:  No distention.  Soft, nontender.  No rebound or guarding. Other:  Rhinorrhea/congestion.  No significant pharyngeal erythema.   ED Results / Procedures / Treatments    MEDICATIONS ORDERED IN ED: Medications - No data to display   IMPRESSION / MDM / [place] / ED COURSE  I reviewed the triage vital signs and the nursing notes.  Patient's presentation is most consistent with acute presentation with potential threat to life or bodily function.  Patient presents emergency department for slight cough,  congestion and subjective fever/chills and body aches.  Symptoms are very suggestive of a viral process.  Overall the patient appears well, no distress, clear lung sounds.  We will swab for COVID/flu.  Patient states she we will follow-up on the results with MyChart but does not wish to wait here for results.  FINAL CLINICAL IMPRESSION(S) / ED DIAGNOSES   Upper respiratory infection Viral syndrome    Note:  This document was prepared using Dragon voice recognition software and may include unintentional dictation errors.   Harvest Dark, MD 09/14/22 1118

## 2022-09-14 NOTE — Discharge Instructions (Addendum)
As we discussed please use Tylenol or ibuprofen every 6 hours as needed for fever/discomfort.  You may also use over-the-counter pseudoephedrine to help with your congestion, as written on the box.  Return to the emergency department for any symptom personally concerning to yourself.  Please wear a mask while in public.

## 2022-09-14 NOTE — ED Triage Notes (Signed)
Pt comes with c/o sore throat, fever and stuffy nose since last week.

## 2022-09-28 ENCOUNTER — Ambulatory Visit: Payer: Medicaid Other | Admitting: Advanced Practice Midwife

## 2022-09-28 ENCOUNTER — Encounter: Payer: Self-pay | Admitting: Advanced Practice Midwife

## 2022-09-28 DIAGNOSIS — Z113 Encounter for screening for infections with a predominantly sexual mode of transmission: Secondary | ICD-10-CM | POA: Diagnosis not present

## 2022-09-28 DIAGNOSIS — A749 Chlamydial infection, unspecified: Secondary | ICD-10-CM

## 2022-09-28 DIAGNOSIS — Z720 Tobacco use: Secondary | ICD-10-CM

## 2022-09-28 DIAGNOSIS — Z62810 Personal history of physical and sexual abuse in childhood: Secondary | ICD-10-CM | POA: Insufficient documentation

## 2022-09-28 LAB — WET PREP FOR TRICH, YEAST, CLUE
Trichomonas Exam: NEGATIVE
Yeast Exam: NEGATIVE

## 2022-09-28 LAB — HM HIV SCREENING LAB: HM HIV Screening: NEGATIVE

## 2022-09-28 LAB — HM HEPATITIS C SCREENING LAB: HM Hepatitis Screen: NEGATIVE
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
## 2022-09-28 NOTE — Progress Notes (Signed)
Wet prep negative and E. Sciora CNM aware. Rich Number, RN

## 2022-10-02 LAB — GONOCOCCUS CULTURE

## 2022-10-05 ENCOUNTER — Telehealth: Payer: Self-pay

## 2022-10-05 NOTE — Telephone Encounter (Signed)
Calling pt re positive chlamydia result from 09/28/22 vaginal specimen. Pt needs tx appt.  Phone call to pt at [phone]. Voicemail not set up; unable to leave message. Tried twice.  Sent MyChart messge.

## 2022-10-05 NOTE — Telephone Encounter (Signed)
Received call back form [phone]. Person stated they were pt's mom and mom provided a new number for pt, [phone]. Mother stated that pt always gives mom's number.  Phone call to [phone]. Pt answered and confirmed password. Pt states she does normally give her mother's number, but we can update her contact info. Pt still wants mom's number listed as a pt contact.  Counseled pt re + CT result. Pt states NKA and is using depo for [facility] and has been using regularly for a while.  Tx appt scheduled for 10/06/22.

## 2022-10-06 ENCOUNTER — Ambulatory Visit: Payer: Medicaid Other | Admitting: Family Medicine

## 2022-10-06 DIAGNOSIS — A749 Chlamydial infection, unspecified: Secondary | ICD-10-CM

## 2022-10-06 MED ORDER — DOXYCYCLINE HYCLATE 100 MG PO TABS: 100.0000 mg | ORAL_TABLET | Freq: Two times a day (BID) | ORAL | 0 refills | Status: DC

## 2022-10-06 NOTE — Progress Notes (Signed)
Pt came with positive Chlamydia result. RN treated her per standing orders and explained medication administration, side effects, and how long to abstain from sex.

## 2022-10-07 ENCOUNTER — Telehealth: Payer: Self-pay

## 2022-10-07 ENCOUNTER — Other Ambulatory Visit: Payer: Self-pay | Admitting: Advanced Practice Midwife

## 2022-10-07 DIAGNOSIS — A749 Chlamydial infection, unspecified: Secondary | ICD-10-CM

## 2022-10-07 MED ORDER — DOXYCYCLINE HYCLATE 100 MG PO TABS: 100.0000 mg | ORAL_TABLET | Freq: Two times a day (BID) | ORAL | 0 refills | Status: AC

## 2022-10-07 NOTE — Telephone Encounter (Addendum)
Phone call to [phone]. Left message on voicemail that Rx had been sent to the [facility] on [address], was sent electronically. Please call ahead to make sure it is ready, but may be able to pick it up tonight.  Pt immediately called back and RN provided info.  (Has [employer].)

## 2022-10-07 NOTE — Telephone Encounter (Signed)
Pt called and stated she received her tx medication for CT yesterday. She has taken one dose. She works third shift and had medication in lunch bag.  She cannot find the med and wonders if she threw medication away with her lunch items. She is requesting more medication.   Please advise if pt can come back to clinic for additional medication dispensed from [facility] stock.  If [facility] does not dispense from our stock again, pt requests rx be sent to pharmacy.

## 2022-10-13 ENCOUNTER — Encounter: Payer: Self-pay | Admitting: Family Medicine

## 2022-10-13 NOTE — Progress Notes (Signed)
Attestation: I agree with the advice given to this patient by our nurse staff.  I have reviewed the RN's note and chart. Documentation reflects my recommendations which were given verbally to the nurse at the point of care.   Austen Wygant [place] Tannie Koskela, MD, MPH, ABFM Medical Director  [facility]  

## 2022-12-16 ENCOUNTER — Ambulatory Visit: Payer: Medicaid Other

## 2023-03-21 ENCOUNTER — Emergency Department: Payer: Medicaid Other

## 2023-03-21 ENCOUNTER — Emergency Department
Admission: EM | Admit: 2023-03-21 | Discharge: 2023-03-21 | Disposition: A | Discharge status: Home / Self Care | Payer: Medicaid Other | Attending: Emergency Medicine | Admitting: Emergency Medicine

## 2023-03-21 ENCOUNTER — Other Ambulatory Visit: Payer: Self-pay

## 2023-03-21 DIAGNOSIS — R519 Headache, unspecified: Secondary | ICD-10-CM | POA: Diagnosis present

## 2023-03-21 DIAGNOSIS — Y92410 Unspecified street and highway as the place of occurrence of the external cause: Secondary | ICD-10-CM | POA: Diagnosis not present

## 2023-03-21 DIAGNOSIS — M545 Low back pain, unspecified: Secondary | ICD-10-CM | POA: Diagnosis not present

## 2023-03-21 DIAGNOSIS — M25552 Pain in left hip: Secondary | ICD-10-CM | POA: Insufficient documentation

## 2023-03-21 DIAGNOSIS — M25551 Pain in right hip: Secondary | ICD-10-CM | POA: Insufficient documentation

## 2023-03-21 LAB — POC URINE PREG, ED: Preg Test, Ur: NEGATIVE

## 2023-03-21 MED ORDER — ACETAMINOPHEN 325 MG PO TABS
650.0000 mg | ORAL_TABLET | Freq: Once | ORAL | Status: AC
  Administered 2023-03-21: 650 mg via ORAL
  Filled 2023-03-21: qty 2

## 2023-03-21 NOTE — ED Notes (Signed)
Pt is alert, oriented with full ROM to all extremities.
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
## 2023-03-22 ENCOUNTER — Ambulatory Visit: Payer: Medicaid Other

## 2023-03-24 ENCOUNTER — Encounter (HOSPITAL_COMMUNITY): Payer: Self-pay

## 2023-03-24 ENCOUNTER — Emergency Department (HOSPITAL_COMMUNITY): Payer: Medicaid Other

## 2023-03-24 ENCOUNTER — Emergency Department (HOSPITAL_COMMUNITY)
Admission: EM | Admit: 2023-03-24 | Discharge: 2023-03-24 | Disposition: A | Discharge status: Home / Self Care | Payer: Medicaid Other | Attending: Emergency Medicine | Admitting: Emergency Medicine

## 2023-03-24 ENCOUNTER — Other Ambulatory Visit: Payer: Self-pay

## 2023-03-24 DIAGNOSIS — R0789 Other chest pain: Secondary | ICD-10-CM | POA: Insufficient documentation

## 2023-03-24 DIAGNOSIS — Y92410 Unspecified street and highway as the place of occurrence of the external cause: Secondary | ICD-10-CM | POA: Insufficient documentation

## 2023-03-24 DIAGNOSIS — M545 Low back pain, unspecified: Secondary | ICD-10-CM | POA: Insufficient documentation

## 2023-03-24 DIAGNOSIS — S7001XA Contusion of right hip, initial encounter: Secondary | ICD-10-CM | POA: Diagnosis not present

## 2023-03-24 DIAGNOSIS — M25551 Pain in right hip: Secondary | ICD-10-CM | POA: Diagnosis present

## 2023-03-24 LAB — POC URINE PREG, ED: Preg Test, Ur: NEGATIVE

## 2023-03-24 MED ORDER — LIDOCAINE 5 % EX PTCH
1.0000 | MEDICATED_PATCH | CUTANEOUS | Status: DC
  Administered 2023-03-24: 1 via TRANSDERMAL
  Filled 2023-03-24: qty 1

## 2023-03-24 NOTE — Discharge Instructions (Signed)
Take Motrin at home for the pain.  He can also take Tylenol as needed.  Follow-up with your primary if symptoms persist.  Return to the ED for emergent conditions.
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
## 2023-12-02 ENCOUNTER — Encounter: Payer: Self-pay | Admitting: Student in an Organized Health Care Education/Training Program

## 2024-01-12 ENCOUNTER — Encounter: Payer: Self-pay | Admitting: Physical Medicine & Rehabilitation

## 2024-02-17 ENCOUNTER — Encounter: Payer: Medicaid Other | Attending: Physical Medicine & Rehabilitation | Admitting: Physical Medicine & Rehabilitation

## 2024-02-17 NOTE — Progress Notes (Deleted)
 Subjective:    Patient ID: Julie [name], female    DOB: [birth year], 28 y.o.   MRN: [id number]  HPI  Pain Inventory Average Pain {NUMBERS; 0-10:5044} Pain Right Now {NUMBERS; 0-10:5044} My pain is {PAIN DESCRIPTION:21022940}  In the last 24 hours, has pain interfered with the following? General activity {NUMBERS; 0-10:5044} Relation with others {NUMBERS; 0-10:5044} Enjoyment of life {NUMBERS; 0-10:5044} What TIME of day is your pain at its worst? {time of day:24191} Sleep (in general) {BHH GOOD/FAIR/POOR:22877}  Pain is worse with: {ACTIVITIES:21022942} Pain improves with: {PAIN IMPROVES WUJW:11914782} Relief from Meds: {NUMBERS; 0-10:5044}  {MOBILITY NFA:21308657}  {FUNCTION:21022946}  {NEURO/PSYCH:21022948}  {CPRM PRIOR STUDIES:21022953}  {CPRM PHYSICIANS INVOLVED IN YOUR CARE:21022954}    Family History  Problem Relation Age of Onset   Anxiety disorder Mother    Depression Mother    Diabetes Mother    Hyperlipidemia Mother    Hypertension Mother    Migraines Maternal Grandmother    Anxiety disorder Maternal Grandmother    Depression Maternal Grandmother    Migraines Maternal Aunt    Migraines Maternal Uncle    Social History   Socioeconomic History   Marital status: Single    Spouse name: Not on file   Number of children: Not on file   Years of education: Not on file   Highest education level: Not on file  Occupational History   Occupation: student  Tobacco Use   Smoking status: Every Day    Types: E-cigarettes   Smokeless tobacco: Never   Tobacco comments:    quit since pregnancy  Vaping Use   Vaping status: Never Used  Substance and Sexual Activity   Alcohol use: Yes    Comment: last use- one week ago -pint of "[place]y"   Drug use: Yes    Frequency: 2.0 times per week    Types: Marijuana, Other-see comments    Comment: last use mid Sept 2023   Sexual activity: Yes    Partners: Male    Birth control/protection: Condom    Comment: hx  depo  Other Topics Concern   Not on file  Social History Narrative   Likes spending time with family   Social Drivers of Corporate investment banker Strain: Not on file  Food Insecurity: Not on file  Transportation Needs: Not on file  Physical Activity: Not on file  Stress: Not on file  Social Connections: Not on file   Past Surgical History:  Procedure Laterality Date   CESAREAN SECTION N/A 01/28/2017   Procedure: CESAREAN SECTION;  Surgeon: Suzy Bouchard, MD;  Location: [facility] ORS;  Service: Obstetrics;  Laterality: N/A;   TONSILLECTOMY Bilateral 10/06/2016   Past Medical History:  Diagnosis Date   Gestational diabetes 10/06/2016   Headache(784.0) 10/06/2004   History of C-section 01/28/2017   There were no vitals taken for this visit.  Opioid Risk Score:   Fall Risk Score:  `1  Depression screen [facility] 2/9     03/06/2021   11:11 AM 11/23/2016    1:39 PM  Depression screen PHQ 2/9  Decreased Interest 2 0  Down, Depressed, Hopeless 2 0  PHQ - 2 Score 4 0  Altered sleeping 1   Tired, decreased energy 1   Change in appetite 2   Feeling bad or failure about yourself  1   Trouble concentrating 0   Moving slowly or fidgety/restless 1   Suicidal thoughts 0   PHQ-9 Score 10   Difficult doing work/chores Somewhat difficult  Review of Systems     Objective:   Physical Exam        Assessment & Plan:
